# Patient Record
Sex: Female | Born: 2000
Health system: Southern US, Community
[De-identification: ages and names within clinical notes are randomized; demographics above are authoritative.]

## PROBLEM LIST (undated history)

## (undated) DIAGNOSIS — R519 Headache, unspecified: Secondary | ICD-10-CM

## (undated) DIAGNOSIS — R51 Headache: Secondary | ICD-10-CM

## (undated) HISTORY — PX: NO PAST SURGERIES: SHX2092

## (undated) HISTORY — DX: Headache, unspecified: R51.9

## (undated) HISTORY — DX: Headache: R51

---
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
## 2020-02-19 DIAGNOSIS — R6889 Other general symptoms and signs: Secondary | ICD-10-CM | POA: Diagnosis not present

## 2020-02-24 ENCOUNTER — Other Ambulatory Visit: Payer: Self-pay | Admitting: Family Medicine

## 2020-02-24 ENCOUNTER — Telehealth: Payer: Self-pay | Admitting: Internal Medicine

## 2020-02-24 ENCOUNTER — Ambulatory Visit
Admission: RE | Admit: 2020-02-24 | Discharge: 2020-02-24 | Disposition: A | Discharge status: Home / Self Care | Payer: BC Managed Care – PPO | Source: Ambulatory Visit | Attending: Family Medicine | Admitting: Family Medicine

## 2020-02-24 DIAGNOSIS — R059 Cough, unspecified: Secondary | ICD-10-CM

## 2020-02-24 DIAGNOSIS — R509 Fever, unspecified: Secondary | ICD-10-CM

## 2020-02-24 DIAGNOSIS — Z03818 Encounter for observation for suspected exposure to other biological agents ruled out: Secondary | ICD-10-CM | POA: Diagnosis not present

## 2020-02-24 DIAGNOSIS — J22 Unspecified acute lower respiratory infection: Secondary | ICD-10-CM | POA: Diagnosis not present

## 2020-02-24 NOTE — Telephone Encounter (Signed)
Patient's mother states the patient got sick last weekend and has had a fever on and off. She states she was tested for strep, the flu, and covid and all were negative. She states she is not sleeping and her watch states she has only been getting 12 minutes of sleep a night. She states two nights ago her fever was 103.7, the next night she had no fever, and yesterday it was 100.8. She states she also has a tremor in her hands. She states she has temperature swings and goes from burning up to needing multiple blankets when the house is 70 degrees. Please advise.

## 2020-02-24 NOTE — Telephone Encounter (Signed)
Spoke with pt who gives verbal permission to speak with pt's mother, [name].  [name] states pt has had fever and assumed URI.  Pt had negative Covid, flu and strep screening at school last week.  [name] states pt was diagnosed with Ehler's - Danlos syndrome by Dr Graciela Husbands and mother is concerned symptoms are related to this diagnosis.  [name] advised while pt was diagnosed with joint laxity I do not see diagnosis of Ehler's Danlos syndrome in Dr Odessa Fleming note.  Pt has not seen her PCP re: current symptoms.  Advised pt's mother to schedule appointment with pt's PCP.  Will have Dr Odessa Fleming scheduler contact pt re: scheduling f/u appt with Dr Graciela Husbands.  [name] advised to keep pt well hydrated.  [name] verbalizes understanding and agrees with current plan.

## 2020-02-26 ENCOUNTER — Other Ambulatory Visit: Payer: Self-pay

## 2020-02-26 ENCOUNTER — Encounter (HOSPITAL_BASED_OUTPATIENT_CLINIC_OR_DEPARTMENT_OTHER): Payer: Self-pay

## 2020-02-26 ENCOUNTER — Emergency Department (HOSPITAL_BASED_OUTPATIENT_CLINIC_OR_DEPARTMENT_OTHER)
Admission: EM | Admit: 2020-02-26 | Discharge: 2020-02-26 | Disposition: A | Discharge status: Home / Self Care | Payer: BC Managed Care – PPO | Attending: Emergency Medicine | Admitting: Emergency Medicine

## 2020-02-26 DIAGNOSIS — B349 Viral infection, unspecified: Secondary | ICD-10-CM | POA: Insufficient documentation

## 2020-02-26 DIAGNOSIS — R509 Fever, unspecified: Secondary | ICD-10-CM | POA: Diagnosis not present

## 2020-02-26 DIAGNOSIS — R0602 Shortness of breath: Secondary | ICD-10-CM | POA: Diagnosis not present

## 2020-02-26 LAB — CBC WITH DIFFERENTIAL/PLATELET
Abs Immature Granulocytes: 0.16 10*3/uL — ABNORMAL HIGH (ref 0.00–0.07)
Basophils Absolute: 0.1 10*3/uL (ref 0.0–0.1)
Basophils Relative: 0 %
Eosinophils Absolute: 0 10*3/uL (ref 0.0–0.5)
Eosinophils Relative: 0 %
HCT: 30.5 % — ABNORMAL LOW (ref 36.0–46.0)
Hemoglobin: 9.9 g/dL — ABNORMAL LOW (ref 12.0–15.0)
Immature Granulocytes: 1 %
Lymphocytes Relative: 10 %
Lymphs Abs: 1.7 10*3/uL (ref 0.7–4.0)
MCH: 30.5 pg (ref 26.0–34.0)
MCHC: 32.5 g/dL (ref 30.0–36.0)
MCV: 93.8 fL (ref 80.0–100.0)
Monocytes Absolute: 0.8 10*3/uL (ref 0.1–1.0)
Monocytes Relative: 5 %
Neutro Abs: 13.5 10*3/uL — ABNORMAL HIGH (ref 1.7–7.7)
Neutrophils Relative %: 84 %
Platelets: 542 10*3/uL — ABNORMAL HIGH (ref 150–400)
RBC: 3.25 MIL/uL — ABNORMAL LOW (ref 3.87–5.11)
RDW: 12.4 % (ref 11.5–15.5)
WBC: 16.2 10*3/uL — ABNORMAL HIGH (ref 4.0–10.5)
nRBC: 0 % (ref 0.0–0.2)

## 2020-02-26 LAB — COMPREHENSIVE METABOLIC PANEL
ALT: 11 U/L (ref 0–44)
AST: 14 U/L — ABNORMAL LOW (ref 15–41)
Albumin: 3.1 g/dL — ABNORMAL LOW (ref 3.5–5.0)
Alkaline Phosphatase: 73 U/L (ref 38–126)
Anion gap: 12 (ref 5–15)
BUN: 7 mg/dL (ref 6–20)
CO2: 21 mmol/L — ABNORMAL LOW (ref 22–32)
Calcium: 9.3 mg/dL (ref 8.9–10.3)
Chloride: 103 mmol/L (ref 98–111)
Creatinine, Ser: 0.8 mg/dL (ref 0.44–1.00)
GFR, Estimated: >60 mL/min (ref >60)
Glucose, Bld: 133 mg/dL — ABNORMAL HIGH (ref 70–99)
Potassium: 3.7 mmol/L (ref 3.5–5.1)
Sodium: 136 mmol/L (ref 135–145)
Total Bilirubin: 0.5 mg/dL (ref 0.3–1.2)
Total Protein: 7.6 g/dL (ref 6.5–8.1)

## 2020-02-26 LAB — TSH: TSH: 0.595 u[IU]/mL (ref 0.350–4.500)

## 2020-02-26 LAB — SEDIMENTATION RATE: Sed Rate: 109 mm/hr — ABNORMAL HIGH (ref 0–22)

## 2020-02-26 MED ORDER — IBUPROFEN 400 MG PO TABS
400.0000 mg | ORAL_TABLET | Freq: Once | ORAL | Status: AC
  Administered 2020-02-26: 400 mg via ORAL
  Filled 2020-02-26: qty 1

## 2020-02-26 NOTE — ED Provider Notes (Signed)
[facility] EMERGENCY DEPARTMENT Provider Note   CSN: [id number] Arrival date & time: 02/26/20  1658   History Chief Complaint  Patient presents with  . Fever    Diana Proctor is a 19 y.o. female w/ PMHx of vertigo, orthostatic hypotension, migraines, sleep disturbance, anxiety/depression who presents for recurrent fever for 12 days.  First day of illness was reported to be 12/3. During this time she was tested for flu, mono, and RSV all of which were negative. Associated symptoms of fatigue, headache, cough, night sweats, nausea, shortness of breath, loss of appetite, insomnia. Started alternating tylenol and motrin this past Saturday after Tmax of 103. Was seen by primary care 2 days ago and CXR normal, subsequently prescribed amoxicillin and doxycycline 2 days ago. Last given tylenol at 3:30 pm after tele health visit informed her to come to the ED for further evaluation.  Of note patient's last period was approximately two weeks ago, in which she bleed through her bed sheets. This has never happened before. She also had a nose bleed 1 week prior that continued for 15-20 but resolved with pinching the nose.   Endorses occasional marijuana use.  Is sexually active; did not use protection during last encounter. Denies abnormal vaginal discharge or odor.  UTD on immunizations. No recent travel history out of the country.    Past Medical History:  Diagnosis Date  . Headache     Patient Active Problem List   Diagnosis Date Noted  . Disequilibrium 05/17/2019  . Migraine without aura and without status migrainosus, not intractable 04/25/2019  . Episodic tension-type headache, not intractable 04/25/2019  . Central positional vertigo 04/25/2019  . Orthostatic hypotension 03/02/2016  . Closed head injury without loss of consciousness 12/31/2015  . Posttraumatic headache 12/31/2015  . Post concussion syndrome 12/31/2015  . Adjustment disorder with mixed anxiety and  depressed mood 01/08/2013  . Acne 01/08/2013  . Sleep disturbance 01/08/2013    Past Surgical History:  Procedure Laterality Date  . NO PAST SURGERIES       OB History   No obstetric history on file.     Family History  Problem Relation Age of Onset  . Depression Mother   . Alzheimer's disease Maternal Grandmother   . Heart attack Maternal Grandfather     Social History   Tobacco Use  . Smoking status: Never Smoker  . Smokeless tobacco: Never Used  Substance Use Topics  . Alcohol use: Never    Comment: social  . Drug use: Never    Home Medications Prior to Admission medications   Medication Sig Start Date End Date Taking? Authorizing Provider  ascorbic acid (VITAMIN C) 500 MG tablet Take 500 mg by mouth daily.    [provider]  co-enzyme Q-10 30 MG capsule Take 30 mg by mouth 3 (three) times daily. Patient not taking: Reported on 11/07/2019    [provider]  Ferrous Sulfate (IRON PO) Take by mouth.    [provider]  fludrocortisone (FLORINEF) 0.1 MG tablet Take 1 tablet daily Patient not taking: Reported on 09/26/2019 07/26/19   Deetta Perla, MD  Melatonin 1 MG TABS Take by mouth.     [provider]  Omega-3 Fatty Acids (FISH OIL) 1000 MG CAPS Take by mouth.    [provider]  pyridoxine (B-6) 100 MG tablet Take 100 mg by mouth daily.    [provider]  sertraline (ZOLOFT) 100 MG tablet Take 100 mg by mouth daily.  07/23/19   [provider]  vitamin B-12 (CYANOCOBALAMIN) 1000 MCG tablet Take 1,000 mcg by mouth daily.    [provider]  Vitamin D, Ergocalciferol, (DRISDOL) 1.25 MG (50000 UNIT) CAPS capsule Take 1 capsule (50,000 Units total) by mouth every 7 (seven) days. 09/23/19   Judi Saa, DO    Allergies    Patient has no known allergies.  Review of Systems   Review of Systems  Constitutional: Positive for appetite change, chills, fatigue and fever.  HENT: Positive  for rhinorrhea.   Respiratory: Positive for cough and shortness of breath.   Cardiovascular: Negative for chest pain.  Gastrointestinal: Negative for abdominal pain, constipation, diarrhea, nausea and vomiting.  Genitourinary: Negative for difficulty urinating, vaginal bleeding and vaginal discharge.  Musculoskeletal: Positive for myalgias.  Skin: Negative for rash.  Neurological: Positive for headaches. Negative for dizziness.  Hematological:       Endorsing increase blood flow with menses and nose bleed.     Physical Exam Updated Vital Signs BP 116/68 (BP Location: Right Arm)   Pulse 82   Temp (!) 102.8 F (39.3 C) (Oral)   Resp 18   Ht 5\' 8"  (1.727 m)   Wt 62.1 kg   LMP 02/12/2020   SpO2 100%   BMI 20.83 kg/m   Physical Exam Constitutional:      General: She is not in acute distress.    Appearance: Normal appearance. She is ill-appearing. She is not toxic-appearing.  HENT:     Head: Normocephalic.     Nose: Nose normal.     Mouth/Throat:     Mouth: Mucous membranes are moist.     Pharynx: No oropharyngeal exudate or posterior oropharyngeal erythema.  Eyes:     General: No scleral icterus.    Extraocular Movements: Extraocular movements intact.     Conjunctiva/sclera: Conjunctivae normal.     Pupils: Pupils are equal, round, and reactive to light.  Cardiovascular:     Rate and Rhythm: Normal rate and regular rhythm.     Pulses: Normal pulses.     Heart sounds: Normal heart sounds.  Pulmonary:     Effort: Pulmonary effort is normal. No respiratory distress.     Breath sounds: Normal breath sounds. No wheezing.  Abdominal:     General: Abdomen is flat. Bowel sounds are normal. There is no distension.     Palpations: Abdomen is soft. There is no mass.     Tenderness: There is no abdominal tenderness. There is no guarding or rebound.  Musculoskeletal:     Cervical back: Neck supple. No tenderness.     Comments: 5/5 strength U+L ext. bilaterally  Lymphadenopathy:      Cervical: No cervical adenopathy.  Skin:    Findings: No bruising or rash.  Neurological:     Mental Status: She is alert and oriented to person, place, and time.  Psychiatric:        Mood and Affect: Mood normal.        Behavior: Behavior normal.    ED Results / Procedures / Treatments   Labs (all labs ordered are listed, but only abnormal results are displayed) Labs Reviewed  CBC WITH DIFFERENTIAL/PLATELET - Abnormal; Notable for the following components:      Result Value   WBC 16.2 (*)    RBC 3.25 (*)    Hemoglobin 9.9 (*)    HCT 30.5 (*)    Platelets 542 (*)    Neutro Abs 13.5 (*)  Abs Immature Granulocytes 0.16 (*)    All other components within normal limits  COMPREHENSIVE METABOLIC PANEL - Abnormal; Notable for the following components:   CO2 21 (*)    Glucose, Bld 133 (*)    Albumin 3.1 (*)    AST 14 (*)    All other components within normal limits  CULTURE, BLOOD (ROUTINE X 2)  CULTURE, BLOOD (ROUTINE X 2)  TSH  T4, FREE  HIV-1/2 AB - DIFFERENTIATION  HIV-1 RNA QUANT-NO REFLEX-BLD  CMV IGM  CMV ANTIBODY, IGG (EIA)  HEPATITIS PANEL, ACUTE  SEDIMENTATION RATE  C-REACTIVE PROTEIN  ANTINUCLEAR ANTIBODIES, IFA  RHEUMATOID FACTOR  LACTATE DEHYDROGENASE   Medications Ordered in ED Medications  ibuprofen (ADVIL) tablet 400 mg (400 mg Oral Given 02/26/20 1837)    ED Course  I have reviewed the triage vital signs and the nursing notes.  Pertinent labs & imaging results that were available during my care of the patient were reviewed by me and considered in my medical decision making (see chart for details).  CBC w/ increased white count, platelets, and neutrophils indicating infectious process CMP unremarkable   MDM Rules/Calculators/A&P                          Initially does not appear well. Febrile given ibuprofen x1 otherwise VSS. No acute findings on physical exam. Review CXR from yesterday and without acute cardiopulmonary findings. Patient  continued to fever through antipyretics and antibiotics (doxycycline and amoxicillin). Patient without travel out of country, known sick antibiotcs, and recently tested negative for flu, mono, RSV, and COVID. Continues to endorse general malaise and body aches.   Consulted Dr. Daiva Eves infectious disease who suggested viral labs, inflammatory markers, autoimmune markers, and hepatitis panel. TB-Quantiferon unable to be obtained due to time sensitive lab. Also will consider CT chest outpatient with persistent fever.   Upon reassessment patient endorsed feeling better. Would like to eat. Discuss patient is stable for discharge home with follow up for infectious disease in 1-2 days. Continue course of antibiotics. Mother and patient agreed with this plan.   Temp 98.3 at time of discharge.   Final Clinical Impression(s) / ED Diagnoses Final diagnoses:  Febrile illness    Rx / DC Orders ED Discharge Orders    None       Autry-Lott, Annely Sliva, DO 02/26/20 2258    Melene Plan, DO 02/26/20 2302

## 2020-02-26 NOTE — ED Triage Notes (Signed)
Pt arrives with mother who reports that she has been tested for covid 3 times, flu, Mono, and RSV. All of which have come back negative. Pt reports she started feeling ill Dec. 3rd. Highest temp at home was 103.7 on Dec. 11th. Mother reports using tylenol and ibuprofen alternating for over a week. She also had a chest X Ray which was clear but was started on amoxacillin and doxy on Monday night.

## 2020-02-26 NOTE — ED Triage Notes (Signed)
Pt had 1000 mg of tylenol at 3:30 pm today when her temp was 102.1.

## 2020-02-26 NOTE — Discharge Instructions (Addendum)
For fever you can continue to alternate tylenol and ibuprofen. Complete full antibiotic course. If symptoms worsen or new symptoms arise return to ED. Schedule appointment with infectious disease in 1-2 days.

## 2020-02-27 ENCOUNTER — Ambulatory Visit (INDEPENDENT_AMBULATORY_CARE_PROVIDER_SITE_OTHER): Payer: BC Managed Care – PPO | Admitting: Infectious Diseases

## 2020-02-27 ENCOUNTER — Telehealth: Payer: Self-pay | Admitting: Infectious Disease

## 2020-02-27 ENCOUNTER — Encounter: Payer: Self-pay | Admitting: Infectious Diseases

## 2020-02-27 VITALS — BP 116/76 | HR 106 | Temp 99.0°F

## 2020-02-27 DIAGNOSIS — R509 Fever, unspecified: Secondary | ICD-10-CM | POA: Diagnosis not present

## 2020-02-27 LAB — C-REACTIVE PROTEIN: CRP: 17.5 mg/dL — ABNORMAL HIGH (ref <1.0)

## 2020-02-27 LAB — T4, FREE: Free T4: 0.82 ng/dL (ref 0.61–1.12)

## 2020-02-27 LAB — HEPATITIS PANEL, ACUTE
HCV Ab: NONREACTIVE
Hep A IgM: NONREACTIVE
Hep B C IgM: NONREACTIVE
Hepatitis B Surface Ag: NONREACTIVE

## 2020-02-27 LAB — LACTATE DEHYDROGENASE: LDH: 127 U/L (ref 98–192)

## 2020-02-27 NOTE — Telephone Encounter (Signed)
Patient with Fever of unknown origin seen twice in the ER.  I recommended labs and promised that we would see her in the clinic next week can she be scheduled with myself or another partner next week?

## 2020-02-27 NOTE — Progress Notes (Signed)
[facility]                                                             [address] [place], [place], [place], [address]                                                                  Phn. [phone]; Fax: [phone]                                                                             Date: 02/27/20  Reason for Referral: Fever of Unknown Origin  Referring Provider: Kathyrn Lass   Assessment/Plan 57 Y O Caucasian Female with a PMH of arachnodactyly/Joint Hypermobility Syndrome with concerns of Ehlers Danlers Syndrome, Dizziness/Orthostatic Hypotension with h/o prior rt hip subluxation, remote hx of traumatic/concussive headaches(2017 who is here for evaluation of fevers going on for more than 2 weeks.   Her history is more compatible with a viral illness. However, she has elevated neutrophilic leukocytosis. She does not look acutely ill. LFTS are normal. HB has dropped from 13.4 to 9.9.  LDH is WNL Will get hemolysis labs with reticulocycte count and haptoglobin. Denies any travel h/o suggestive of babesiosis.  ESR 109, CRP 17.5, RF borderline at 14.2  Acute Hepatitis panel for Hep A/B and C is negative. HIV is pending. CMV is pending.  Blood cx 12/15 are pending   Will get following labs today 2 more sets of blood cultures Resp PCR panel  Monospot EBV panel Human Parvo Vitrus PCR  Reticulocyte count and haptoglobin Qunatiferon CK  Will follow up in 1 weekI have discussed with patient and parents concerning signs and symptoms for immediate visit to the hospital.   All questions and concerns were discussed and addressed. Patient verbalized understanding of the plan. ____________________________________________________________________________________________________________________  HPI: 19 Y O Caucasian Female with a PMH of arachnodactyly/Joint Hypermobility Syndrome with concerns  of Ehlers Danlers Syndrome, Dizziness/Orthostatic Hypotension with h/o prior rt hip subluxation, remote hx of traumatic/concussive headaches(2017 who is here for evaluation of fevers going on for more than 2 weeks.   Patient started having fever since beginning of December. This was associated with cough, congestion, chills, sweating, body aches and sore throat and fatigue. She also complains of having drenching sweats 1 week prior to having fever. Mother says she had a " stomach"  bug in January 2021 for which she was seen in the ED after which she contracted COVID from her father in February. She was not symptomatic from COVID and did not need to be hospitalized or require COVID specific monoclonal antibodies and antivirals. She however had loss of taste and loss of smell. Mother says she continued to have lingering symptoms after having COVID.   She was also  seen by Dr Delice Lesch for dizziness and was offered to have handicapped sticker which she declined. She started having tremors in October followed by difficulty in sleeping for which she was prescribed Hydroxyzine. However, it did not help her to sleep. She continued to be more exhausted and fatigued. She started having drenching sweats at the end of November before starting to have fevers in early December.  Fever was as high as 103. She has been taking tylenol and Ibuprofen alternatively for fevers. She was also tested for Flu/Mono and RSV on 12/3 which was negative. Was seen by primary care 3 days ago and CXR normal, She was given 7 days course of Amoxicillin and Doxycycline which she has been taking currently. Denies any benefit from taking abx,   Appetite is not so good. Has lost 5-6 lbs weight recently Denies n/v/d. Denies urinary sympyoms. Bilateral hip and knee pain + Back pain+ Headache. Denies sinus pain and ear pain  Denies rashes  Denies heaadache, blurry vision and hearing problem. Denies dysphagia/odynophagia Denies h/o frequent  infections. Denies any insect bite, tick bite In 2016 went to [place] for mission trip. Denies travel out of the state except [place] and [place] Denies any hiking but has been participating in rowing.    Born in [place]. She is currently student at [employer], [place] and came home to [facility] for christmas break. She has a dog  And 2 fish at school Smokes marijuana occasionally and drinks alcohol socially and denies using drugs.  Parents also concerned about use of any drugs in drinks during her college parties at school.   She is sexually active with a female partner. Last sexual activity a month ago. Last LMP 2 weeks ago. Denies any vaginal pain/discharge. Denies any urinary symptoms. She has a Cu IUD placed few years ago.   ROS: 11 point ROS negative except as above   Past Medical History:  Diagnosis Date  . Headache    Past Surgical History:  Procedure Laterality Date  . NO PAST SURGERIES     Current Outpatient Medications on File Prior to Visit  Medication Sig Dispense Refill  . amoxicillin (AMOXIL) 500 MG tablet Take by mouth.    . doxycycline (VIBRA-TABS) 100 MG tablet 1 tablet    . hydrOXYzine (ATARAX/VISTARIL) 25 MG tablet Take 25-50 mg by mouth daily as needed.    . Melatonin 1 MG TABS Take by mouth.     . sertraline (ZOLOFT) 100 MG tablet Take 100 mg by mouth daily.    Marland Kitchen ascorbic acid (VITAMIN C) 500 MG tablet Take 500 mg by mouth daily. (Patient not taking: Reported on 02/27/2020)    . co-enzyme Q-10 30 MG capsule Take 30 mg by mouth 3 (three) times daily. (Patient not taking: No sig reported)    . Ferrous Sulfate (IRON PO) Take by mouth. (Patient not taking: Reported on 02/27/2020)    . fludrocortisone (FLORINEF) 0.1 MG tablet Take 1 tablet daily (Patient not taking: No sig reported) 31 tablet 5  . Omega-3 Fatty Acids (FISH OIL) 1000 MG CAPS Take by mouth. (Patient not taking: Reported on 02/27/2020)    . pyridoxine (B-6) 100 MG tablet Take 100 mg by mouth daily. (Patient not taking: Reported  on 02/27/2020)    . vitamin B-12 (CYANOCOBALAMIN) 1000 MCG tablet Take 1,000 mcg by mouth daily. (Patient not taking: Reported on 02/27/2020)    . Vitamin D, Ergocalciferol, (DRISDOL) 1.25 MG (50000 UNIT) CAPS capsule Take 1 capsule (50,000 Units total) by  mouth every 7 (seven) days. (Patient not taking: Reported on 02/27/2020) 12 capsule 0   No current facility-administered medications on file prior to visit.   No Known Allergies  Social History   Socioeconomic History  . Marital status: Single    Spouse name: Not on file  . Number of children: Not on file  . Years of education: Not on file  . Highest education level: Not on file  Occupational History  . Not on file  Tobacco Use  . Smoking status: Never Smoker  . Smokeless tobacco: Never Used  Substance and Sexual Activity  . Alcohol use: Never    Comment: social  . Drug use: Never  . Sexual activity: Not on file  Other Topics Concern  . 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   She attended [employer].   She lives with both parents and her sister.   She enjoys Research officer, trade union, baking, rowing and eating.   She is a Museum/gallery exhibitions officer at [employer].   Social Determinants of Health   Financial Resource Strain: Not on file  Food Insecurity: Not on file  Transportation Needs: Not on file  Physical Activity: Not on file  Stress: Not on file  Social Connections: Not on file  Intimate Partner Violence: Not on file    Vitals BP 116/76   Pulse (!) 106   Temp 99 F (37.2 C) (Oral)   LMP 02/12/2020    Examination  General - not in acute distress, comfortably sitting in chair, EXHAUSTED LOOK HEENT - PEERLA, no pallor and no icterus, NO REDNESS IN THE POSTERIOR PHARYNGEAL WALL, BULGE IN THR TONSILS Chest - b/l clear air entry, no additional sounds CVS- Normal s1s2, RRR Abdomen - Soft, Non tender , non distended Ext- no pedal edema Neuro: grossly normal Back - WNL Psych : calm  and cooperative Skin - no obvious rashes    Recent labs CBC Latest Ref Rng & Units 02/26/2020 09/23/2019  WBC 4.0 - 10.5 K/uL 16.2(H) 5.0  Hemoglobin 12.0 - 15.0 g/dL 9.9(L) 13.4  Hematocrit 36.0 - 46.0 % 30.5(L) 40.3  Platelets 150 - 400 K/uL 542(H) 227   CMP Latest Ref Rng & Units 02/26/2020 09/23/2019 09/23/2019  Glucose 70 - 99 mg/dL 133(H) 77 -  BUN 6 - 20 mg/dL 7 14 -  Creatinine 0.44 - 1.00 mg/dL 0.80 0.86 -  Sodium 135 - 145 mmol/L 136 140 -  Potassium 3.5 - 5.1 mmol/L 3.7 4.6 -  Chloride 98 - 111 mmol/L 103 104 -  CO2 22 - 32 mmol/L 21(L) 28 -  Calcium 8.9 - 10.3 mg/dL 9.3 10.1 10.1  Total Protein 6.5 - 8.1 g/dL 7.6 7.0 -  Total Bilirubin 0.3 - 1.2 mg/dL 0.5 1.1 -  Alkaline Phos 38 - 126 U/L 73 - -  AST 15 - 41 U/L 14(L) 18 -  ALT 0 - 44 U/L 11 11 -     Pertinent Microbiology Results for orders placed or performed in visit on 04/29/19  Novel Coronavirus, NAA ([facility])     Status: Abnormal   Collection Time: 04/29/19  1:39 PM   Specimen: Nasopharyngeal(NP) swabs in vial transport medium   NASOPHARYNGE  TESTING  Result Value Ref Range Status   SARS-CoV-2, NAA Detected (A) Not Detected Final    Comment: Testing was performed using the cobas(R) SARS-CoV-2 test. This nucleic acid amplification test was developed and its performance characteristics determined by Becton, Dickinson and Company. Nucleic  acid amplification tests include RT-PCR and TMA. This test has not been FDA cleared or approved. This test has been authorized by FDA under an Emergency Use Authorization (EUA). This test is only authorized for the duration of time the declaration that circumstances exist justifying the authorization of the emergency use of in vitro diagnostic tests for detection of SARS-CoV-2 virus and/or diagnosis of COVID-19 infection under section 564(b)(1) of the Act, 21 U.S.C. 257DYN-1(G) (1), unless the authorization is terminated or revoked sooner. When diagnostic testing is negative, the  possibility of a false negative result should be considered in the context of a patient's recent exposures and the presence of clinical signs and symptoms consistent with COVID-19. An individual without sympto ms of COVID-19 and who is not shedding SARS-CoV-2 virus would expect to have a negative (not detected) result in this assay.     Pertinent Imaging Chest Xray 02/24/20 FINDINGS: The heart size and mediastinal contours are within normal limits. Both lungs are clear. The visualized skeletal structures are unremarkable.  IMPRESSION: No active cardiopulmonary disease.  All pertinent labs/Imagings/notes reviewed. All pertinent plain films and CT images have been personally visualized and interpreted; radiology reports have been reviewed. Decision making incorporated into the Impression / Recommendations.  I spent greater than 50 minutes with the patient including  review of prior medical records with greater than 50% of time in face to face counsel of the patient.    Electronically signed by:  Rosiland Oz, MD Infectious Disease Physician [facility] [address] [place], [place] [address] Phone: [phone]  Fax: [phone]

## 2020-02-27 NOTE — Telephone Encounter (Signed)
Patient scheduled with Dr. Elinor Parkinson today. Thanks

## 2020-02-27 NOTE — Telephone Encounter (Signed)
Excellent

## 2020-02-28 DIAGNOSIS — R509 Fever, unspecified: Secondary | ICD-10-CM | POA: Insufficient documentation

## 2020-02-28 LAB — HIV-1 RNA QUANT-NO REFLEX-BLD

## 2020-02-28 LAB — CMV IGM: CMV IgM: <30 AU/mL (ref 0.0–29.9)

## 2020-02-28 LAB — RHEUMATOID FACTOR: Rheumatoid fact SerPl-aCnc: 14.2 IU/mL — ABNORMAL HIGH (ref <14.0)

## 2020-02-28 LAB — CMV ANTIBODY, IGG (EIA): CMV Ab - IgG: 3 U/mL — ABNORMAL HIGH (ref 0.00–0.59)

## 2020-02-28 NOTE — Assessment & Plan Note (Signed)
Unclear cause so far Will get additional labs today  FU in 1 week

## 2020-02-29 LAB — ANTINUCLEAR ANTIBODIES, IFA: ANA Ab, IFA: POSITIVE — AB

## 2020-02-29 LAB — FANA STAINING PATTERNS: Homogeneous Pattern: 1

## 2020-03-01 LAB — HIV-1/2 AB - DIFFERENTIATION
HIV 1 Ab: NEGATIVE
HIV 2 Ab: NEGATIVE
Note: NEGATIVE

## 2020-03-01 LAB — RNA QUALITATIVE: HIV 1 RNA Qualitative: 1

## 2020-03-02 LAB — CULTURE, BLOOD (ROUTINE X 2)
Culture: NO GROWTH
Culture: NO GROWTH
Special Requests: ADEQUATE
Special Requests: ADEQUATE

## 2020-03-04 ENCOUNTER — Ambulatory Visit (INDEPENDENT_AMBULATORY_CARE_PROVIDER_SITE_OTHER): Payer: BC Managed Care – PPO | Admitting: Internal Medicine

## 2020-03-04 ENCOUNTER — Other Ambulatory Visit: Payer: Self-pay

## 2020-03-04 ENCOUNTER — Encounter: Payer: Self-pay | Admitting: Internal Medicine

## 2020-03-04 VITALS — BP 112/66 | HR 91 | Ht 68.0 in | Wt 132.0 lb

## 2020-03-04 DIAGNOSIS — I951 Orthostatic hypotension: Secondary | ICD-10-CM

## 2020-03-04 LAB — MONONUCLEOSIS SCREEN: Heterophile, Mono Screen: NEGATIVE

## 2020-03-04 LAB — CULTURE, BLOOD (SINGLE)
MICRO NUMBER:: 11331619
MICRO NUMBER:: 11331620
Result:: NO GROWTH
Result:: NO GROWTH
SPECIMEN QUALITY:: ADEQUATE
SPECIMEN QUALITY:: ADEQUATE

## 2020-03-04 LAB — QUANTIFERON-TB GOLD PLUS
Mitogen-NIL: 0.97 IU/mL
NIL: 0.01 IU/mL
QuantiFERON-TB Gold Plus: NEGATIVE
TB1-NIL: 0 IU/mL
TB2-NIL: 0.01 IU/mL

## 2020-03-04 LAB — EPSTEIN-BARR VIRUS VCA ANTIBODY PANEL
EBV NA IgG: 185 U/mL — ABNORMAL HIGH
EBV VCA IgG: 168 U/mL — ABNORMAL HIGH
EBV VCA IgM: <36 U/mL

## 2020-03-04 LAB — HUMAN PARVOVIRUS DNA DETECTION BY PCR: PARVOVIRUS B19 DNA,QL REAL TIME PCR: NOT DETECTED

## 2020-03-04 LAB — CK: Total CK: 27 U/L — ABNORMAL LOW (ref 29–143)

## 2020-03-04 LAB — RETICULOCYTES
ABS Retic: 24850 cells/uL (ref 20000–8000)
Retic Ct Pct: 0.7 %

## 2020-03-04 LAB — HAPTOGLOBIN: Haptoglobin: 514 mg/dL — ABNORMAL HIGH (ref 43–212)

## 2020-03-04 MED ORDER — IRON (FERROUS SULFATE) 325 (65 FE) MG PO TABS: 325.0000 mg | ORAL_TABLET | Freq: Two times a day (BID) | ORAL | Status: DC

## 2020-03-04 NOTE — Progress Notes (Signed)
      Patient Care Team: Sigmund Hazel, MD as PCP - General (Family Medicine)   HPI  Diana Proctor is a 19 y.o. female Seen in follow-up for symptoms of dizziness and presyncope, orthostatic and heat intolerance with a remote history of concussive headaches and intercurrent viral/Covid infection  Functional status has been pretty good over the fall of her sophomore year at [employer].  She is struggled with lightheadedness and shortness of breath with palpitations while walking particularly up hills.  Metromenorrhagia but has not been impressed that her symptoms are worse at the time of her.  Uses some alcohol but no association of symptoms.  She has been on Zoloft for some time.  Her sleep over recent months has become notably worse.  This has been aggravated/informed by the use of wearable technology.  She is also had problems worsening of night sweats.  Been noted also over the last 2 weeks to have fevers as high as 103.  Also noted a tremor over recent weeks; over Thanksgiving also with significant fatigue  History of hip subluxation    Records and Results Reviewed   Past Medical History:  Diagnosis Date  . Headache     Past Surgical History:  Procedure Laterality Date  . NO PAST SURGERIES      Current Meds  Medication Sig  . hydrOXYzine (ATARAX/VISTARIL) 25 MG tablet Take 25-50 mg by mouth daily as needed.  . Melatonin 1 MG TABS Take by mouth.   . sertraline (ZOLOFT) 100 MG tablet Take 100 mg by mouth daily.    No Known Allergies    Review of Systems negative except from HPI and PMH  Physical Exam BP 112/66   Pulse 91   Ht 5\' 8"  (1.727 m)   Wt 132 lb (59.9 kg)   LMP 02/12/2020   SpO2 97%   BMI 20.07 kg/m  Well developed and well nourished in no acute distress HENT normal E scleral and icterus clear Neck Supple JVP flat; carotids brisk and full Clear to ausculation  Regular rate and rhythm, no murmurs gallops or rub Soft with active bowel sounds No  clubbing cyanosis  Edema Alert and oriented, grossly normal motor and sensory function Skin Warm and Dry  ECG *sinus at 91 Interval 15/07/36  Estimated Creatinine Clearance: 107 mL/min (by C-G formula based on SCr of 0.8 mg/dL).   Assessment and  Plan  Dysautonomia  Ehlers-Danlos/hypermobility  Arachnodactyly  Fever of unknown origin  Tremor  Sleep disturbance    The patient's dysautonomia is modestly well compensated. She struggles with heat. Her salt and fluid repletion is incomplete. Encouraged her to increase these  She has significant metromenorrhagia. She is anemic. We will start her on iron sulfate 1 pill twice daily. I have asked her to consider oral contraceptive suppression of her menses.  We will give her a handicap sticker so as to try to minimize her symptoms walking around campus.  With her history of joint subluxation, I think referral to PT and or orthopedics/rheumatology would be appropriate for long-term management of the try to avoid things like arthritis  She is being seen currently by infectious disease for her fevers. Perhaps her tremors are related.      Current medicines are reviewed at length with the patient today .  The patient does not  have concerns regarding medicines.

## 2020-03-04 NOTE — Patient Instructions (Signed)
Medication Instructions:  Your physician has recommended you make the following change in your medication:   ** Begin Iron Sulfate - 1 tablet by mouth twice daily.  *If you need a refill on your cardiac medications before your next appointment, please call your pharmacy*   Lab Work: None ordered.  If you have labs (blood work) drawn today and your tests are completely normal, you will receive your results only by: Marland Kitchen MyChart Message (if you have MyChart) OR . A paper copy in the mail If you have any lab test that is abnormal or we need to change your treatment, we will call you to review the results.   Testing/Procedures: None ordered.    Follow-Up: At [facility], you and your health needs are our priority.  As part of our continuing mission to provide you with exceptional heart care, we have created designated Provider Care Teams.  These Care Teams include your primary Cardiologist (physician) and Advanced Practice Providers (APPs -  Physician Assistants and Nurse Practitioners) who all work together to provide you with the care you need, when you need it.  We recommend signing up for the patient portal called "MyChart".  Sign up information is provided on this After Visit Summary.  MyChart is used to connect with patients for Virtual Visits (Telemedicine).  Patients are able to view lab/test results, encounter notes, upcoming appointments, etc.  Non-urgent messages can be sent to your provider as well.   To learn more about what you can do with MyChart, go to [email or web address].    Your next appointment:   6 month(s)  The format for your next appointment:   In Person  Provider:    Dr Graciela Husbands   Other Instructions Provided Handicap Parking Placard Application

## 2020-03-05 ENCOUNTER — Encounter: Payer: Self-pay | Admitting: Internal Medicine

## 2020-03-05 ENCOUNTER — Ambulatory Visit (INDEPENDENT_AMBULATORY_CARE_PROVIDER_SITE_OTHER): Payer: BC Managed Care – PPO | Admitting: Internal Medicine

## 2020-03-05 VITALS — BP 112/74 | HR 80 | Temp 98.2°F | Wt 132.0 lb

## 2020-03-05 DIAGNOSIS — R509 Fever, unspecified: Secondary | ICD-10-CM

## 2020-03-05 NOTE — Patient Instructions (Addendum)
Thank you for seeing [place] today   I am glad your fever at this time has resolved. However would still be on the look out for ongoing fever, and record heart rate per minute around the time of fever  Today, will complete the hiv testing and repeat liver function/blood count   In around 4-6 weeks, I would advise also to repeat hepatitis blood test, which you can do at [facility]. At that time, please make a video visit if you are unable to physically be here (make sure you do blood tests 1-2 weeks before that and have results with you)

## 2020-03-05 NOTE — Progress Notes (Signed)
[facility]  Patient Active Problem List   Diagnosis Date Noted  . Fever of unknown origin 02/28/2020  . Disequilibrium 05/17/2019  . Migraine without aura and without status migrainosus, not intractable 04/25/2019  . Episodic tension-type headache, not intractable 04/25/2019  . Central positional vertigo 04/25/2019  . Orthostatic hypotension 03/02/2016  . Closed head injury without loss of consciousness 12/31/2015  . Posttraumatic headache 12/31/2015  . Post concussion syndrome 12/31/2015  . Adjustment disorder with mixed anxiety and depressed mood 01/08/2013  . Acne 01/08/2013  . Sleep disturbance 01/08/2013      Subjective:    Patient ID: Diana Proctor, female    DOB: [birth year], 19 y.o.   MRN: [id number]  Chief Complaint  Patient presents with  . Follow-up    Fever, unspecified fever cause    HPI:  Diana Proctor is a 19 y.o. 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 No further fever She mentions her arms look a little yellow but not her eyes; no pruritus No headache, rash, further joint pain, n/v/diarrhea today  Parents accompany her today and are fine  No Known Allergies    Outpatient Medications Prior to Visit  Medication Sig Dispense Refill  . hydrOXYzine (ATARAX/VISTARIL) 25 MG tablet Take 25-50 mg by mouth daily as needed.    . Iron, Ferrous Sulfate, 325 (65 Fe) MG TABS Take 325 mg by mouth 2 (two) times daily. 30 tablet   . Melatonin 1 MG TABS Take by mouth.      . sertraline (ZOLOFT) 100 MG tablet Take 100 mg by mouth daily.     No facility-administered medications prior to visit.     Social History   Socioeconomic History  . Marital status: Single    Spouse name: Not on file  . Number of children: Not on file  . Years of education: Not on file  . Highest education level: Not on file  Occupational History  . Not on file  Tobacco Use  . Smoking status: Never Smoker  . Smokeless tobacco: Never Used  Substance and Sexual Activity  . Alcohol use: Never    Comment: social  . Drug use: Never  . Sexual activity: Not on file  Other Topics Concern  . 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   She attended [employer].   She lives with both parents and her sister.   She enjoys Doctor, hospital, baking, rowing and eating.   She is a Printmaker at [employer].   Social Determinants of Health   Financial Resource Strain: Not on file  Food Insecurity: Not on file  Transportation Needs: Not on file  Physical Activity: Not on file  Stress: Not on file  Social Connections: Not on file  Intimate Partner Violence: Not on file      Review of Systems   other ros negative  Objective:    BP 112/74   Pulse  80   Temp 98.2 F (36.8 C) (Oral)   Wt 132 lb (59.9 kg)   LMP 02/12/2020   BMI 20.07 kg/m  Nursing note and vital signs reviewed.  Physical Exam    well appearing; no distress; conversant Normocephalic; per; eomi; conj clear Neck supple cv rrr no mrg Lungs clear; normal respiratory effort abd s/nt Ext no edema Skin no jaundice or rash msk no synovitis Neuro nonfocal   Labs: Reviewed 12/15 normal lft; wbc 16 neutrophilic  Micro: 32/95 bcx negative  Serology: 12/15 hep b/a/c serology negative; hiv ab negative; parvovirus pcr negative  Imaging:  Assessment & Plan:   Problem List Items Addressed This Visit      Other   Fever of unknown origin - Primary     Young  female without significant problem or id epidemiology except active in a relationship with boyfriend who had 2 weeks of fever and gi/uri sx, here for f/u  Her fever and most of the sx had resolved  We reviewed labs especially the hep b/hiv labs which the hiv viral load not done, and the hepatitis serology needs to be repeated  I agree this is likely a nonspecific viral syndrome. I discussed for hep b the serology might take a few more weeks to be positive if its the case. Similar for hiv, the test might not be positive early in the course.  If fever recurrent again, I would consider blood smear and pan-body scan    -cbc, cmp today along with hiv viral load -f/u 6 weeks with 2 week preclinic hepatitis abc serology repeat; can do video visit -f/u sooner if ongoing fever or new sx of concern   I am having Diana Proctor maintain her melatonin, sertraline, hydrOXYzine, and Iron (Ferrous Sulfate).   No orders of the defined types were placed in this encounter.    Follow-up: Return in about 6 weeks (around 04/16/2020).      Raymondo Band, MD [facility] -- -- pager   [phone] cell 03/05/2020, 9:11 AM

## 2020-03-09 LAB — RESPIRATORY VIRUS PCR, PANEL
ADENOVIRUS DNA,QL PCR: NOT DETECTED
HUMAN METAPNEUMOVIRUS RNA, QL RT PCR: NOT DETECTED
INFLUENZA B RNA,PCR: NOT DETECTED
Influenza A: NOT DETECTED
PARAINFLUENZA 4 RNA: NOT DETECTED
Parainfluenza 1: NOT DETECTED
Parainfluenza 2: NOT DETECTED
RHNOVRS RNA RT PCR: NOT DETECTED
RSV RNA QL PCR: NOT DETECTED

## 2020-03-17 LAB — CBC WITH DIFFERENTIAL/PLATELET
Absolute Monocytes: 384 cells/uL (ref 200–950)
Basophils Absolute: 64 cells/uL (ref 0–200)
Basophils Relative: 0.8 %
Eosinophils Absolute: 88 cells/uL (ref 15–500)
Eosinophils Relative: 1.1 %
HCT: 32.8 % — ABNORMAL LOW (ref 35.0–45.0)
Hemoglobin: 10.7 g/dL — ABNORMAL LOW (ref 11.7–15.5)
Lymphs Abs: 2368 cells/uL (ref 850–3900)
MCH: 30 pg (ref 27.0–33.0)
MCHC: 32.6 g/dL (ref 32.0–36.0)
MCV: 91.9 fL (ref 80.0–100.0)
MPV: 9 fL (ref 7.5–12.5)
Monocytes Relative: 4.8 %
Neutro Abs: 5096 cells/uL (ref 1500–7800)
Neutrophils Relative %: 63.7 %
Platelets: 511 10*3/uL — ABNORMAL HIGH (ref 140–400)
RBC: 3.57 10*6/uL — ABNORMAL LOW (ref 3.80–5.10)
RDW: 12.3 % (ref 11.0–15.0)
Total Lymphocyte: 29.6 %
WBC: 8 10*3/uL (ref 3.8–10.8)

## 2020-03-17 LAB — COMPREHENSIVE METABOLIC PANEL
AG Ratio: 1.1 (calc) (ref 1.0–2.5)
ALT: 6 U/L (ref 5–32)
AST: 11 U/L — ABNORMAL LOW (ref 12–32)
Albumin: 3.8 g/dL (ref 3.6–5.1)
Alkaline phosphatase (APISO): 79 U/L (ref 36–128)
BUN: 13 mg/dL (ref 7–20)
CO2: 27 mmol/L (ref 20–32)
Calcium: 9.8 mg/dL (ref 8.9–10.4)
Chloride: 103 mmol/L (ref 98–110)
Creat: 0.77 mg/dL (ref 0.50–1.00)
Globulin: 3.5 g/dL (calc) (ref 2.0–3.8)
Glucose, Bld: 90 mg/dL (ref 65–99)
Potassium: 4.5 mmol/L (ref 3.8–5.1)
Sodium: 140 mmol/L (ref 135–146)
Total Bilirubin: 0.4 mg/dL (ref 0.2–1.1)
Total Protein: 7.3 g/dL (ref 6.3–8.2)

## 2020-03-17 LAB — HIV-1 RNA QUANT-NO REFLEX-BLD
HIV 1 RNA Quant: <20 Copies/mL
HIV-1 RNA Quant, Log: <1.3 Log cps/mL

## 2020-03-19 ENCOUNTER — Telehealth: Payer: Self-pay | Admitting: *Deleted

## 2020-03-19 NOTE — Telephone Encounter (Signed)
-----   Message from Raymondo Band, MD sent at 03/19/2020  1:39 PM EST ----- Hi team Could we let her know the hiv testing was negative  Her inflammatory number is improving She should do the labs (hepatitis) that we have discussed (and I have written prescription for in the next few weeks. And fax [place] result once available  Follow up with [place] only as needed if ongoing fever or abnormal hepatitis labs  thanks

## 2020-03-19 NOTE — Telephone Encounter (Signed)
Relayed Dr Orlando Penner message to patient. She verbalized understanding, agreement.  Will send mychart message with fax number for lab results to be sent to Dr Renold Don once obtained. Andree Coss, RN

## 2020-04-07 ENCOUNTER — Telehealth: Payer: BC Managed Care – PPO | Admitting: Internal Medicine

## 2020-04-09 ENCOUNTER — Telehealth: Payer: BC Managed Care – PPO | Admitting: Internal Medicine

## 2020-04-28 DIAGNOSIS — F419 Anxiety disorder, unspecified: Secondary | ICD-10-CM | POA: Diagnosis not present

## 2020-04-28 DIAGNOSIS — Q796 Ehlers-Danlos syndrome, unspecified: Secondary | ICD-10-CM | POA: Diagnosis not present

## 2020-05-04 ENCOUNTER — Other Ambulatory Visit: Payer: Self-pay

## 2020-05-04 ENCOUNTER — Telehealth: Payer: Self-pay

## 2020-05-04 ENCOUNTER — Telehealth (INDEPENDENT_AMBULATORY_CARE_PROVIDER_SITE_OTHER): Payer: BC Managed Care – PPO | Admitting: Internal Medicine

## 2020-05-04 ENCOUNTER — Encounter: Payer: Self-pay | Admitting: Internal Medicine

## 2020-05-04 DIAGNOSIS — R509 Fever, unspecified: Secondary | ICD-10-CM

## 2020-05-04 NOTE — Telephone Encounter (Signed)
RN spoke with [facility] to request that patient's labs from last week be faxed to our office. They state that they have no record of the patient being at their clinic since December of 2021. They state they have no labs that our office requested on file.   Sandie Ano, RN

## 2020-05-04 NOTE — Progress Notes (Signed)
[facility]  Patient Active Problem List   Diagnosis Date Noted  . Fever of unknown origin 02/28/2020  . Disequilibrium 05/17/2019  . Migraine without aura and without status migrainosus, not intractable 04/25/2019  . Episodic tension-type headache, not intractable 04/25/2019  . Central positional vertigo 04/25/2019  . Orthostatic hypotension 03/02/2016  . Closed head injury without loss of consciousness 12/31/2015  . Posttraumatic headache 12/31/2015  . Post concussion syndrome 12/31/2015  . Adjustment disorder with mixed anxiety and depressed mood 01/08/2013  . Acne 01/08/2013  . Sleep disturbance 01/08/2013      Subjective:    Patient ID: Diana Proctor, female    DOB: [birth year], 20 y.o.   MRN: [id number]  Chief Complaint  Patient presents with  . Follow-up    HPI:  Diana Proctor is a 20 y.o. female here for f/u fever unknown origin  05/04/20 id clinic f/u I verified that I was speaking with the correct person using two identifiers. Due to the COVID-19 Pandemic, this service was provided via telemedicine using audio/visual media.   The patient was located at home. The provider was located in the office. The patient did consent to this visit and is aware of charges through their insurance as well as the limitations of evaluation and management by telemedicine. Other persons participating in this telemedicine service were none. 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 No further fever She mentions her arms look a little yellow but not her eyes; no pruritus No headache, rash, further joint pain, n/v/diarrhea today  Parents accompany her today and are fine  No Known Allergies    Outpatient Medications Prior to Visit  Medication Sig Dispense Refill  . Melatonin 1 MG TABS Take by mouth.     . sertraline (ZOLOFT) 100 MG tablet Take 100 mg by mouth daily.    . sertraline (ZOLOFT) 50 MG tablet Take 75 mg by mouth daily.    . hydrOXYzine (ATARAX/VISTARIL) 25 MG tablet Take 25-50 mg by mouth daily as needed. (Patient not taking: Reported on 05/04/2020)    . Iron, Ferrous Sulfate, 325 (65 Fe) MG TABS Take 325 mg by mouth 2 (two) times daily. (Patient not taking: Reported on 05/04/2020) 30 tablet    No facility-administered medications prior to visit.     Social History   Socioeconomic History  . Marital status: Single    Spouse name: Not on file  . Number of children: Not on file  . Years of education: Not on file  . Highest education level: Not on file  Occupational History  . Not on file  Tobacco Use  . Smoking status: Never Smoker  . Smokeless tobacco: Never Used  Substance and Sexual Activity  .  Alcohol use: Yes    Comment: social  . Drug use: Never  . Sexual activity: Not on file  Other Topics Concern  . 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   She attended [employer].   She lives with both parents and her sister.   She enjoys Doctor, hospital, baking, rowing and eating.   She is a Printmaker at [employer].   Social Determinants of Health   Financial Resource Strain: Not on file  Food Insecurity: Not on file  Transportation Needs: Not on file  Physical  Activity: Not on file  Stress: Not on file  Social Connections: Not on file  Intimate Partner Violence: Not on file      Review of Systems   other ros negative  Objective:    There were no vitals taken for this visit. Nursing note and vital signs reviewed.  Physical Exam    well appearing; no distress; conversant Normocephalic; per; eomi; conj clear Lungs clear; normal respiratory effort  Rest of exam deferred as this is a televideo visit  Labs: Reviewed 12/15 normal lft; wbc 16 neutrophilic  Micro: 45/62 bcx negative  Serology: 12/15 hep b/a/c serology negative; hiv ab negative; parvovirus pcr negative  Imaging:  Assessment & Plan:   Problem List Items Addressed This Visit      Other   Fever of unknown origin - Primary     Young female without significant problem or id epidemiology except active in a relationship with boyfriend who had 2 weeks of fever and gi/uri sx, here for f/u  Her fever and most of the sx had resolved since 03/05/20 visit. I discussed with her to get repeat hepatitis testing done due to long incubation and potential for chronic assymptomatic infection. She is agreeable   -patient to do hep abc blood test at her school. She is to send [place] a message via mychart once it done so we can call her school to get result when available -otherwise f/u our clinic as needed   Follow-up: Return if symptoms worsen or fail to improve.  This is a video visit    Raymondo Band, MD [facility] -- -- pager   [phone] cell 05/04/2020, 2:45 PM

## 2020-06-14 DIAGNOSIS — Z03818 Encounter for observation for suspected exposure to other biological agents ruled out: Secondary | ICD-10-CM | POA: Diagnosis not present

## 2020-06-14 DIAGNOSIS — J029 Acute pharyngitis, unspecified: Secondary | ICD-10-CM | POA: Diagnosis not present

## 2020-06-14 DIAGNOSIS — R509 Fever, unspecified: Secondary | ICD-10-CM | POA: Diagnosis not present

## 2020-06-18 DIAGNOSIS — R509 Fever, unspecified: Secondary | ICD-10-CM | POA: Diagnosis not present

## 2020-06-18 DIAGNOSIS — R059 Cough, unspecified: Secondary | ICD-10-CM | POA: Diagnosis not present

## 2020-07-01 DIAGNOSIS — N83201 Unspecified ovarian cyst, right side: Secondary | ICD-10-CM | POA: Diagnosis not present

## 2020-07-01 DIAGNOSIS — Z309 Encounter for contraceptive management, unspecified: Secondary | ICD-10-CM | POA: Diagnosis not present

## 2020-07-16 ENCOUNTER — Encounter (INDEPENDENT_AMBULATORY_CARE_PROVIDER_SITE_OTHER): Payer: Self-pay

## 2020-08-19 DIAGNOSIS — Z23 Encounter for immunization: Secondary | ICD-10-CM | POA: Diagnosis not present

## 2020-08-19 DIAGNOSIS — N83202 Unspecified ovarian cyst, left side: Secondary | ICD-10-CM | POA: Diagnosis not present

## 2020-08-19 DIAGNOSIS — Z113 Encounter for screening for infections with a predominantly sexual mode of transmission: Secondary | ICD-10-CM | POA: Diagnosis not present

## 2020-08-19 DIAGNOSIS — Z0000 Encounter for general adult medical examination without abnormal findings: Secondary | ICD-10-CM | POA: Diagnosis not present

## 2020-08-19 DIAGNOSIS — D649 Anemia, unspecified: Secondary | ICD-10-CM | POA: Diagnosis not present

## 2020-08-19 DIAGNOSIS — Z13220 Encounter for screening for lipoid disorders: Secondary | ICD-10-CM | POA: Diagnosis not present

## 2020-08-19 DIAGNOSIS — Q796 Ehlers-Danlos syndrome, unspecified: Secondary | ICD-10-CM | POA: Diagnosis not present

## 2020-08-19 DIAGNOSIS — N898 Other specified noninflammatory disorders of vagina: Secondary | ICD-10-CM | POA: Diagnosis not present

## 2020-09-03 DIAGNOSIS — N83209 Unspecified ovarian cyst, unspecified side: Secondary | ICD-10-CM | POA: Diagnosis not present

## 2020-09-03 DIAGNOSIS — Z30431 Encounter for routine checking of intrauterine contraceptive device: Secondary | ICD-10-CM | POA: Diagnosis not present

## 2020-09-04 ENCOUNTER — Emergency Department (HOSPITAL_COMMUNITY): Payer: BC Managed Care – PPO

## 2020-09-04 ENCOUNTER — Encounter (HOSPITAL_COMMUNITY)
Admission: EM | Disposition: A | Discharge status: Home / Self Care | Payer: Self-pay | Source: Home / Self Care | Attending: Emergency Medicine

## 2020-09-04 ENCOUNTER — Ambulatory Visit (HOSPITAL_COMMUNITY)
Admission: EM | Admit: 2020-09-04 | Discharge: 2020-09-04 | Disposition: A | Discharge status: Home / Self Care | Payer: BC Managed Care – PPO | Attending: Emergency Medicine | Admitting: Emergency Medicine

## 2020-09-04 ENCOUNTER — Emergency Department (HOSPITAL_COMMUNITY): Payer: BC Managed Care – PPO | Admitting: Anesthesiology

## 2020-09-04 ENCOUNTER — Encounter (HOSPITAL_COMMUNITY): Payer: Self-pay | Admitting: Emergency Medicine

## 2020-09-04 ENCOUNTER — Other Ambulatory Visit: Payer: Self-pay

## 2020-09-04 ENCOUNTER — Other Ambulatory Visit: Payer: Self-pay | Admitting: Obstetrics & Gynecology

## 2020-09-04 DIAGNOSIS — N83201 Unspecified ovarian cyst, right side: Secondary | ICD-10-CM | POA: Diagnosis not present

## 2020-09-04 DIAGNOSIS — Z20822 Contact with and (suspected) exposure to covid-19: Secondary | ICD-10-CM | POA: Diagnosis not present

## 2020-09-04 DIAGNOSIS — N838 Other noninflammatory disorders of ovary, fallopian tube and broad ligament: Secondary | ICD-10-CM | POA: Diagnosis not present

## 2020-09-04 DIAGNOSIS — N83202 Unspecified ovarian cyst, left side: Secondary | ICD-10-CM | POA: Diagnosis not present

## 2020-09-04 DIAGNOSIS — I951 Orthostatic hypotension: Secondary | ICD-10-CM | POA: Diagnosis not present

## 2020-09-04 DIAGNOSIS — R102 Pelvic and perineal pain: Secondary | ICD-10-CM | POA: Insufficient documentation

## 2020-09-04 DIAGNOSIS — G43009 Migraine without aura, not intractable, without status migrainosus: Secondary | ICD-10-CM | POA: Diagnosis not present

## 2020-09-04 DIAGNOSIS — N7092 Oophoritis, unspecified: Secondary | ICD-10-CM | POA: Diagnosis not present

## 2020-09-04 DIAGNOSIS — Z79899 Other long term (current) drug therapy: Secondary | ICD-10-CM | POA: Insufficient documentation

## 2020-09-04 DIAGNOSIS — L709 Acne, unspecified: Secondary | ICD-10-CM | POA: Diagnosis not present

## 2020-09-04 HISTORY — PX: LAPAROSCOPIC OVARIAN CYSTECTOMY: SHX6248

## 2020-09-04 LAB — RESP PANEL BY RT-PCR (FLU A&B, COVID) ARPGX2
Influenza A by PCR: NEGATIVE
Influenza B by PCR: NEGATIVE
SARS Coronavirus 2 by RT PCR: NEGATIVE

## 2020-09-04 LAB — TYPE AND SCREEN
ABO/RH(D): A POS
Antibody Screen: NEGATIVE

## 2020-09-04 LAB — ABO/RH: ABO/RH(D): A POS

## 2020-09-04 LAB — COMPREHENSIVE METABOLIC PANEL
ALT: 11 U/L (ref 0–44)
AST: 18 U/L (ref 15–41)
Albumin: 4.5 g/dL (ref 3.5–5.0)
Alkaline Phosphatase: 49 U/L (ref 38–126)
Anion gap: 10 (ref 5–15)
BUN: 11 mg/dL (ref 6–20)
CO2: 21 mmol/L — ABNORMAL LOW (ref 22–32)
Calcium: 9.7 mg/dL (ref 8.9–10.3)
Chloride: 105 mmol/L (ref 98–111)
Creatinine, Ser: 0.84 mg/dL (ref 0.44–1.00)
GFR, Estimated: >60 mL/min (ref >60)
Glucose, Bld: 102 mg/dL — ABNORMAL HIGH (ref 70–99)
Potassium: 3.9 mmol/L (ref 3.5–5.1)
Sodium: 136 mmol/L (ref 135–145)
Total Bilirubin: 0.9 mg/dL (ref 0.3–1.2)
Total Protein: 7.1 g/dL (ref 6.5–8.1)

## 2020-09-04 LAB — URINALYSIS, ROUTINE W REFLEX MICROSCOPIC
Bilirubin Urine: NEGATIVE
Glucose, UA: NEGATIVE mg/dL
Hgb urine dipstick: NEGATIVE
Ketones, ur: NEGATIVE mg/dL
Leukocytes,Ua: NEGATIVE
Nitrite: NEGATIVE
Protein, ur: NEGATIVE mg/dL
Specific Gravity, Urine: 1.017 (ref 1.005–1.030)
pH: 6 (ref 5.0–8.0)

## 2020-09-04 LAB — CBC
HCT: 41.2 % (ref 36.0–46.0)
Hemoglobin: 13.3 g/dL (ref 12.0–15.0)
MCH: 32.6 pg (ref 26.0–34.0)
MCHC: 32.3 g/dL (ref 30.0–36.0)
MCV: 101 fL — ABNORMAL HIGH (ref 80.0–100.0)
Platelets: 280 10*3/uL (ref 150–400)
RBC: 4.08 MIL/uL (ref 3.87–5.11)
RDW: 12.8 % (ref 11.5–15.5)
WBC: 13 10*3/uL — ABNORMAL HIGH (ref 4.0–10.5)
nRBC: 0 % (ref 0.0–0.2)

## 2020-09-04 LAB — GC/CHLAMYDIA PROBE AMP (~~LOC~~) NOT AT ARMC
Chlamydia: NEGATIVE
Comment: NEGATIVE
Comment: NORMAL
Neisseria Gonorrhea: NEGATIVE

## 2020-09-04 LAB — I-STAT BETA HCG BLOOD, ED (MC, WL, AP ONLY): I-stat hCG, quantitative: <5 m[IU]/mL (ref <5)

## 2020-09-04 LAB — WET PREP, GENITAL
Clue Cells Wet Prep HPF POC: NONE SEEN
Sperm: NONE SEEN
Trich, Wet Prep: NONE SEEN
Yeast Wet Prep HPF POC: NONE SEEN

## 2020-09-04 LAB — LIPASE, BLOOD: Lipase: 38 U/L (ref 11–51)

## 2020-09-04 LAB — PREGNANCY, URINE: Preg Test, Ur: NEGATIVE

## 2020-09-04 SURGERY — EXCISION, CYST, OVARY, LAPAROSCOPIC
Anesthesia: General | Laterality: Left

## 2020-09-04 MED ORDER — LEVOFLOXACIN IN D5W 500 MG/100ML IV SOLN
500.0000 mg | INTRAVENOUS | Status: AC
  Administered 2020-09-04: 500 mg via INTRAVENOUS
  Filled 2020-09-04 (×2): qty 100

## 2020-09-04 MED ORDER — CEFAZOLIN SODIUM-DEXTROSE 2-4 GM/100ML-% IV SOLN
INTRAVENOUS | Status: AC
  Filled 2020-09-04: qty 100

## 2020-09-04 MED ORDER — MORPHINE SULFATE (PF) 4 MG/ML IV SOLN
4.0000 mg | Freq: Once | INTRAVENOUS | Status: AC
Start: 2020-09-04 — End: 2020-09-04
  Administered 2020-09-04: 4 mg via INTRAVENOUS
  Filled 2020-09-04: qty 1

## 2020-09-04 MED ORDER — ONDANSETRON 4 MG PO TBDP: 4.0000 mg | ORAL_TABLET | Freq: Three times a day (TID) | ORAL | 0 refills | Status: AC | PRN

## 2020-09-04 MED ORDER — CHLORHEXIDINE GLUCONATE 0.12 % MT SOLN: 15.0000 mL | Freq: Once | OROMUCOSAL | Status: AC

## 2020-09-04 MED ORDER — ONDANSETRON HCL 4 MG/2ML IJ SOLN
INTRAMUSCULAR | Status: DC | PRN
  Administered 2020-09-04: 4 mg via INTRAVENOUS

## 2020-09-04 MED ORDER — MEPERIDINE HCL 25 MG/ML IJ SOLN
6.2500 mg | INTRAMUSCULAR | Status: DC | PRN
Start: 2020-09-04 — End: 2020-09-05

## 2020-09-04 MED ORDER — FENTANYL CITRATE (PF) 100 MCG/2ML IJ SOLN
INTRAMUSCULAR | Status: AC
  Administered 2020-09-04: 50 ug via INTRAVENOUS
  Filled 2020-09-04: qty 2

## 2020-09-04 MED ORDER — DEXAMETHASONE SODIUM PHOSPHATE 10 MG/ML IJ SOLN
INTRAMUSCULAR | Status: DC | PRN
  Administered 2020-09-04: 5 mg via INTRAVENOUS

## 2020-09-04 MED ORDER — FENTANYL CITRATE (PF) 100 MCG/2ML IJ SOLN: 50.0000 ug | Freq: Once | INTRAMUSCULAR | Status: AC

## 2020-09-04 MED ORDER — AMISULPRIDE (ANTIEMETIC) 5 MG/2ML IV SOLN
10.0000 mg | Freq: Once | INTRAVENOUS | Status: AC | PRN
  Administered 2020-09-04: 10 mg via INTRAVENOUS

## 2020-09-04 MED ORDER — BUPIVACAINE HCL (PF) 0.25 % IJ SOLN
INTRAMUSCULAR | Status: DC | PRN
  Administered 2020-09-04: 30 mL

## 2020-09-04 MED ORDER — KETOROLAC TROMETHAMINE 30 MG/ML IJ SOLN
INTRAMUSCULAR | Status: DC | PRN
  Administered 2020-09-04: 30 mg via INTRAVENOUS

## 2020-09-04 MED ORDER — LACTATED RINGERS IV SOLN: INTRAVENOUS | Status: DC

## 2020-09-04 MED ORDER — ONDANSETRON HCL 4 MG/2ML IJ SOLN
4.0000 mg | Freq: Once | INTRAMUSCULAR | Status: AC
  Administered 2020-09-04: 4 mg via INTRAVENOUS
  Filled 2020-09-04: qty 2

## 2020-09-04 MED ORDER — ORAL CARE MOUTH RINSE: 15.0000 mL | Freq: Once | OROMUCOSAL | Status: AC

## 2020-09-04 MED ORDER — NAPROXEN 375 MG PO TABS: 375.0000 mg | ORAL_TABLET | Freq: Two times a day (BID) | ORAL | 0 refills | Status: DC | PRN

## 2020-09-04 MED ORDER — KETOROLAC TROMETHAMINE 30 MG/ML IJ SOLN
INTRAMUSCULAR | Status: AC
  Filled 2020-09-04: qty 1

## 2020-09-04 MED ORDER — CEFAZOLIN SODIUM-DEXTROSE 2-4 GM/100ML-% IV SOLN
2.0000 g | INTRAVENOUS | Status: AC
  Administered 2020-09-04: 2 g via INTRAVENOUS

## 2020-09-04 MED ORDER — CHLORHEXIDINE GLUCONATE 0.12 % MT SOLN
OROMUCOSAL | Status: AC
  Administered 2020-09-04: 15 mL via OROMUCOSAL
  Filled 2020-09-04: qty 15

## 2020-09-04 MED ORDER — DEXMEDETOMIDINE (PRECEDEX) IN NS 20 MCG/5ML (4 MCG/ML) IV SYRINGE
PREFILLED_SYRINGE | INTRAVENOUS | Status: DC | PRN
  Administered 2020-09-04 (×2): 8 ug via INTRAVENOUS

## 2020-09-04 MED ORDER — OXYCODONE HCL 5 MG PO TABS: 5.0000 mg | ORAL_TABLET | Freq: Once | ORAL | Status: DC | PRN

## 2020-09-04 MED ORDER — SUGAMMADEX SODIUM 200 MG/2ML IV SOLN
INTRAVENOUS | Status: DC | PRN
  Administered 2020-09-04: 125 mg via INTRAVENOUS

## 2020-09-04 MED ORDER — HYDROMORPHONE HCL 1 MG/ML IJ SOLN
0.2500 mg | INTRAMUSCULAR | Status: DC | PRN
  Administered 2020-09-04: 0.25 mg via INTRAVENOUS

## 2020-09-04 MED ORDER — KETOROLAC TROMETHAMINE 30 MG/ML IJ SOLN
30.0000 mg | Freq: Once | INTRAMUSCULAR | Status: AC
  Administered 2020-09-04: 30 mg via INTRAVENOUS
  Filled 2020-09-04: qty 1

## 2020-09-04 MED ORDER — AMISULPRIDE (ANTIEMETIC) 5 MG/2ML IV SOLN
INTRAVENOUS | Status: AC
  Filled 2020-09-04: qty 4

## 2020-09-04 MED ORDER — ACETAMINOPHEN 10 MG/ML IV SOLN
INTRAVENOUS | Status: AC
  Filled 2020-09-04: qty 100

## 2020-09-04 MED ORDER — MIDAZOLAM HCL 2 MG/2ML IJ SOLN
INTRAMUSCULAR | Status: AC
  Filled 2020-09-04: qty 2

## 2020-09-04 MED ORDER — BUPIVACAINE HCL (PF) 0.25 % IJ SOLN
INTRAMUSCULAR | Status: AC
  Filled 2020-09-04: qty 30

## 2020-09-04 MED ORDER — POVIDONE-IODINE 10 % EX SWAB
2.0000 "application " | Freq: Once | CUTANEOUS | Status: AC
  Administered 2020-09-04: 2 via TOPICAL

## 2020-09-04 MED ORDER — LIDOCAINE 2% (20 MG/ML) 5 ML SYRINGE
INTRAMUSCULAR | Status: DC | PRN
  Administered 2020-09-04: 80 mg via INTRAVENOUS

## 2020-09-04 MED ORDER — PROMETHAZINE HCL 25 MG/ML IJ SOLN: 6.2500 mg | INTRAMUSCULAR | Status: DC | PRN

## 2020-09-04 MED ORDER — METHYLENE BLUE 0.5 % INJ SOLN
INTRAVENOUS | Status: AC
  Filled 2020-09-04: qty 10

## 2020-09-04 MED ORDER — PROPOFOL 10 MG/ML IV BOLUS
INTRAVENOUS | Status: AC
  Filled 2020-09-04: qty 40

## 2020-09-04 MED ORDER — DOXYCYCLINE HYCLATE 50 MG PO CAPS: 100.0000 mg | ORAL_CAPSULE | Freq: Two times a day (BID) | ORAL | 0 refills | Status: AC

## 2020-09-04 MED ORDER — FENTANYL CITRATE (PF) 250 MCG/5ML IJ SOLN
INTRAMUSCULAR | Status: DC | PRN
  Administered 2020-09-04 (×2): 25 ug via INTRAVENOUS
  Administered 2020-09-04 (×2): 50 ug via INTRAVENOUS

## 2020-09-04 MED ORDER — ACETAMINOPHEN 10 MG/ML IV SOLN
INTRAVENOUS | Status: DC | PRN
  Administered 2020-09-04: 1000 mg via INTRAVENOUS

## 2020-09-04 MED ORDER — OXYCODONE HCL 5 MG PO TABS: 5.0000 mg | ORAL_TABLET | Freq: Four times a day (QID) | ORAL | 0 refills | Status: AC | PRN

## 2020-09-04 MED ORDER — HYDROMORPHONE HCL 1 MG/ML IJ SOLN
INTRAMUSCULAR | Status: AC
  Filled 2020-09-04: qty 1

## 2020-09-04 MED ORDER — SODIUM CHLORIDE 0.9 % IV BOLUS
1000.0000 mL | Freq: Once | INTRAVENOUS | Status: AC
  Administered 2020-09-04: 1000 mL via INTRAVENOUS

## 2020-09-04 MED ORDER — ROCURONIUM BROMIDE 10 MG/ML (PF) SYRINGE
PREFILLED_SYRINGE | INTRAVENOUS | Status: DC | PRN
  Administered 2020-09-04 (×2): 20 mg via INTRAVENOUS
  Administered 2020-09-04: 60 mg via INTRAVENOUS

## 2020-09-04 MED ORDER — MORPHINE SULFATE (PF) 4 MG/ML IV SOLN
4.0000 mg | Freq: Once | INTRAVENOUS | Status: AC
  Administered 2020-09-04: 4 mg via INTRAVENOUS
  Filled 2020-09-04: qty 1

## 2020-09-04 MED ORDER — METRONIDAZOLE 500 MG PO TABS: 500.0000 mg | ORAL_TABLET | Freq: Two times a day (BID) | ORAL | 0 refills | Status: AC

## 2020-09-04 MED ORDER — MIDAZOLAM HCL 2 MG/2ML IJ SOLN
INTRAMUSCULAR | Status: DC | PRN
  Administered 2020-09-04: 2 mg via INTRAVENOUS

## 2020-09-04 MED ORDER — ONDANSETRON 4 MG PO TBDP: 4.0000 mg | ORAL_TABLET | Freq: Three times a day (TID) | ORAL | 0 refills | Status: DC | PRN

## 2020-09-04 MED ORDER — PROPOFOL 10 MG/ML IV BOLUS
INTRAVENOUS | Status: DC | PRN
  Administered 2020-09-04: 150 mg via INTRAVENOUS
  Administered 2020-09-04: 30 mg via INTRAVENOUS

## 2020-09-04 MED ORDER — FENTANYL CITRATE (PF) 250 MCG/5ML IJ SOLN
INTRAMUSCULAR | Status: AC
  Filled 2020-09-04: qty 5

## 2020-09-04 MED ORDER — OXYCODONE HCL 5 MG/5ML PO SOLN
5.0000 mg | Freq: Once | ORAL | Status: DC | PRN
Start: 2020-09-04 — End: 2020-09-05

## 2020-09-04 SURGICAL SUPPLY — 37 items
APPLICATOR COTTON TIP 6 STRL (MISCELLANEOUS) ×1 IMPLANT
APPLICATOR COTTON TIP 6IN STRL (MISCELLANEOUS) ×2 IMPLANT
CABLE HIGH FREQUENCY MONO STRZ (ELECTRODE) IMPLANT
CATH ROBINSON RED A/P 16FR (CATHETERS) ×2 IMPLANT
CNTNR URN SCR LID CUP LEK RST (MISCELLANEOUS) ×1 IMPLANT
CONT SPEC 4OZ STRL OR WHT (MISCELLANEOUS) ×2
DERMABOND ADHESIVE PROPEN (GAUZE/BANDAGES/DRESSINGS) ×1
DERMABOND ADVANCED (GAUZE/BANDAGES/DRESSINGS) ×1
DERMABOND ADVANCED .7 DNX12 (GAUZE/BANDAGES/DRESSINGS) ×1 IMPLANT
DERMABOND ADVANCED .7 DNX6 (GAUZE/BANDAGES/DRESSINGS) ×1 IMPLANT
DRSG OPSITE POSTOP 3X4 (GAUZE/BANDAGES/DRESSINGS) IMPLANT
DURAPREP 26ML APPLICATOR (WOUND CARE) ×2 IMPLANT
FILTER SMOKE EVAC LAPAROSHD (FILTER) IMPLANT
GLOVE SURG ENC MOIS LTX SZ7 (GLOVE) ×2 IMPLANT
GLOVE SURG UNDER POLY LF SZ7 (GLOVE) ×4 IMPLANT
GOWN STRL REUS W/ TWL LRG LVL3 (GOWN DISPOSABLE) ×3 IMPLANT
GOWN STRL REUS W/TWL LRG LVL3 (GOWN DISPOSABLE) ×6
KIT TURNOVER KIT B (KITS) ×2 IMPLANT
LIGASURE VESSEL 5MM BLUNT TIP (ELECTROSURGICAL) IMPLANT
MANIPULATOR UTERINE 4.5 ZUMI (MISCELLANEOUS) ×2 IMPLANT
NS IRRIG 1000ML POUR BTL (IV SOLUTION) ×2 IMPLANT
PACK LAPAROSCOPY BASIN (CUSTOM PROCEDURE TRAY) ×2 IMPLANT
PACK TRENDGUARD 450 HYBRID PRO (MISCELLANEOUS) IMPLANT
POUCH SPECIMEN RETRIEVAL 10MM (ENDOMECHANICALS) IMPLANT
PROTECTOR NERVE ULNAR (MISCELLANEOUS) ×4 IMPLANT
SCISSORS LAP 5X35 DISP (ENDOMECHANICALS) ×2 IMPLANT
SET IRRIG TUBING LAPAROSCOPIC (IRRIGATION / IRRIGATOR) ×2 IMPLANT
SET TUBE SMOKE EVAC HIGH FLOW (TUBING) ×2 IMPLANT
SLEEVE ENDOPATH XCEL 5M (ENDOMECHANICALS) ×4 IMPLANT
SOLUTION ELECTROLUBE (MISCELLANEOUS) IMPLANT
SUT VICRYL 0 UR6 27IN ABS (SUTURE) ×2 IMPLANT
SUT VICRYL 4-0 PS2 18IN ABS (SUTURE) ×2 IMPLANT
TOWEL GREEN STERILE FF (TOWEL DISPOSABLE) ×4 IMPLANT
TRENDGUARD 450 HYBRID PRO PACK (MISCELLANEOUS)
TROCAR BALLN 12MMX100 BLUNT (TROCAR) IMPLANT
TROCAR XCEL NON-BLD 5MMX100MML (ENDOMECHANICALS) ×2 IMPLANT
WARMER LAPAROSCOPE (MISCELLANEOUS) ×2 IMPLANT

## 2020-09-04 NOTE — Transfer of Care (Signed)
Immediate Anesthesia Transfer of Care Note  Patient: Diana Proctor  Procedure(s) Performed: LAPAROSCOPIC OVARIAN CYSTECTOMY (Left)  Patient Location: PACU  Anesthesia Type:General  Level of Consciousness: drowsy  Airway & Oxygen Therapy: Patient Spontanous Breathing  Post-op Assessment: Report given to RN and Post -op Vital signs reviewed and stable  Post vital signs: Reviewed and stable  Last Vitals:  Vitals Value Taken Time  BP    Temp    Pulse 94 09/04/20 1900  Resp 21 09/04/20 1900  SpO2 100 % 09/04/20 1900  Vitals shown include unvalidated device data.  Last Pain:  Vitals:   09/04/20 1151  TempSrc: Oral  PainSc: 7          Complications: No notable events documented.

## 2020-09-04 NOTE — Anesthesia Postprocedure Evaluation (Signed)
Anesthesia Post Note  Patient: Diana Proctor  Procedure(s) Performed: LAPAROSCOPIC OVARIAN CYSTECTOMY (Left)     Patient location during evaluation: PACU Anesthesia Type: General Level of consciousness: awake and alert Pain management: pain level controlled Vital Signs Assessment: post-procedure vital signs reviewed and stable Respiratory status: spontaneous breathing, nonlabored ventilation, respiratory function stable and patient connected to nasal cannula oxygen Cardiovascular status: blood pressure returned to baseline and stable Postop Assessment: no apparent nausea or vomiting Anesthetic complications: no   No notable events documented.  Last Vitals:  Vitals:   09/04/20 1932 09/04/20 1947  BP: (!) 102/56 (!) 104/56  Pulse: 76 72  Resp: (!) 21 18  Temp:  36.5 C  SpO2: 95% 95%    Last Pain:  Vitals:   09/04/20 1947  TempSrc:   PainSc: Asleep                 Cecile Hearing

## 2020-09-04 NOTE — Anesthesia Procedure Notes (Signed)
Procedure Name: Intubation Date/Time: 09/04/2020 4:43 PM Performed by: Rande Brunt, CRNA Pre-anesthesia Checklist: Patient identified, Emergency Drugs available, Suction available and Patient being monitored Patient Re-evaluated:Patient Re-evaluated prior to induction Oxygen Delivery Method: Circle System Utilized Preoxygenation: Pre-oxygenation with 100% oxygen Induction Type: IV induction Ventilation: Mask ventilation without difficulty Laryngoscope Size: Mac and 3 Grade View: Grade I Tube type: Oral Tube size: 7.0 mm Number of attempts: 1 Airway Equipment and Method: Stylet Placement Confirmation: ETT inserted through vocal cords under direct vision, positive ETCO2 and breath sounds checked- equal and bilateral Secured at: 22 cm Tube secured with: Tape Dental Injury: Teeth and Oropharynx as per pre-operative assessment

## 2020-09-04 NOTE — ED Triage Notes (Signed)
Pt c/o lower abdominal pain that started 45 mins pta. Endorses nausea, denies vomiting/diarrhea, urinary symptoms.

## 2020-09-04 NOTE — ED Notes (Signed)
Pt in ultrasound

## 2020-09-04 NOTE — ED Provider Notes (Signed)
Care assumed from [facility], please see her note for full details, but in brief Diana Proctor is a 20 y.o. female who presents with severe abdominal pain that began at 1 AM and awoke patient from sleep, pain to the pelvic area that felt like a cramp but more severe than anything she has experienced previously.  Does have known history of ovarian cyst on the left.  Saw OB/GYN yesterday and was scheduled for outpatient ultrasound.  Labs have been reassuring, patient not sexually active, no significant discharge on exam or concern for PID.  Ultrasound pending at shift change to rule out ovarian torsion.   Physical Exam  BP (!) 107/54   Pulse 68   Temp 98.1 F (36.7 C) (Oral)   Resp 20   LMP  (LMP Unknown)   SpO2 100%   Physical Exam Vitals and nursing note reviewed.  Constitutional:      General: She is not in acute distress.    Appearance: Normal appearance. She is well-developed. She is not ill-appearing or diaphoretic.  HENT:     Head: Normocephalic and atraumatic.  Eyes:     General:        Right eye: No discharge.        Left eye: No discharge.  Pulmonary:     Effort: Pulmonary effort is normal. No respiratory distress.  Abdominal:     Comments: Abdomen soft, nondistended, bowel sounds present throughout, there is some generalized mild abdominal tenderness, worse in the lower abdomen.  Neurological:     Mental Status: She is alert and oriented to person, place, and time.     Coordination: Coordination normal.  Psychiatric:        Mood and Affect: Mood normal.        Behavior: Behavior normal.    ED Course/Procedures   Labs Reviewed  WET PREP, GENITAL - Abnormal; Notable for the following components:      Result Value   WBC, Wet Prep HPF POC MANY (*)    All other components within normal limits  COMPREHENSIVE METABOLIC PANEL - Abnormal; Notable for the following components:   CO2 21 (*)    Glucose, Bld 102 (*)    All other components within normal limits   CBC - Abnormal; Notable for the following components:   WBC 13.0 (*)    MCV 101.0 (*)    All other components within normal limits  LIPASE, BLOOD  URINALYSIS, ROUTINE W REFLEX MICROSCOPIC  I-STAT BETA HCG BLOOD, ED ([facility], [facility], [facility] ONLY)  GC/CHLAMYDIA PROBE AMP ([place]) NOT AT [facility]   US PELVIC COMPLETE W TRANSVAGINAL AND TORSION R/O  Result Date: 09/04/2020 CLINICAL DATA:  Pelvic pain.  History of left ovarian cyst. EXAM: TRANSABDOMINAL AND TRANSVAGINAL ULTRASOUND OF PELVIS DOPPLER ULTRASOUND OF OVARIES TECHNIQUE: Both transabdominal and transvaginal ultrasound examinations of the pelvis were performed. Transabdominal technique was performed for global imaging of the pelvis including uterus, ovaries, adnexal regions, and pelvic cul-de-sac. It was necessary to proceed with endovaginal exam following the transabdominal exam to visualize the endometrium, uterus, bilateral ovaries, bilateral adnexae. Color and duplex Doppler ultrasound was utilized to evaluate blood flow to the ovaries. COMPARISON:  None. FINDINGS: Uterus Measurements: 10.7 x 3.7 x 5.1 cm = volume: 105 mL. Trace volume free fluid within the endometrial canal. A T-shaped intrauterine device is noted appropriately position within the endometrial canal. No fibroids or other mass visualized. Endometrium Thickness: 7 mm.  No focal abnormality visualized. Right ovary Measurements: 4.3  x 2.8 x 3.6 cm = volume: 23 mL. There is a cystic lesion measuring 2 cm. Normal appearance/no adnexal mass. Left ovary Measurements: 6.4 x 4.6 x 5.8 cm = volume: 90 mL. There is a 3.7 x 4.4 x 4.8 cm left ovarian cystic lesion with associated multiple irregular thick septations (ultrasound 1-11). No definite mural nodularity or vascularity. Otherwise normal appearance of the left ovary. Pulsed Doppler evaluation of both ovaries demonstrates normal low-resistance arterial and venous waveforms. Other findings No abnormal free fluid. IMPRESSION: 1. Complex 4.8 cm left  ovarian cystic lesion with associated multiple irregular thick septations. Recommend gynecologic consultation. 2. No findings to suggest ovarian torsion/ischemia; however, this does not exclude ovarian torsion as this can be an intermittent finding. 3. Well-positioned T-shaped intrauterine device. Electronically Signed   By: Tish Frederickson M.D.   On: 09/04/2020 06:45     Procedures  MDM   Ultrasound shows a complex 4.8 left ovarian cystic lesion with associated multiple irregular thick septations, recommended gynecologic consultation.  No findings currently to suggest ovarian torsion or ischemia, however radiologist notes this does not completely exclude torsion as this can be intermittent.  IUD is well-positioned.  Patient has some more mild pain still present, will give some additional pain control.  Patient was scheduled to have an outpatient ultrasound done later today, but will have her call to speak with her gynecologist this morning.   I discussed case with Dr. Juliene Pina, patient's OB/GYN who saw her in the clinic yesterday, given patient's continued pain she is concerned, and in particular given complex appearance of cyst on ultrasound that has been persistent now for several months.  Dr. Juliene Pina spoke with patient and patient's mother over the phone as well, and after discussing together they decided patient would prefer to proceed with surgery today for removal of ovarian cyst if possible.  Dr. Juliene Pina was initially going to have her colleague try and do the surgery, but she called back stating that she has posted the patient for surgery at [facility] today at 1230 and will be doing the procedure herself.  She will see the patient prior to surgery in the preop area.  I discussed this plan with the patient and mom who expressed understanding and agreement.  COVID screening test ordered for the OR.    Dartha Lodge, PA-C 09/04/20 1006    Glynn Octave, MD 09/04/20 [phone]

## 2020-09-04 NOTE — Anesthesia Preprocedure Evaluation (Signed)
Anesthesia Evaluation  Patient identified by MRN, date of birth, ID band Patient awake    Reviewed: Allergy & Precautions, NPO status , Patient's Chart, lab work & pertinent test results  Airway Mallampati: II  TM Distance: >3 FB Neck ROM: Full    Dental no notable dental hx.    Pulmonary neg pulmonary ROS,    Pulmonary exam normal breath sounds clear to auscultation       Cardiovascular negative cardio ROS Normal cardiovascular exam Rhythm:Regular Rate:Normal     Neuro/Psych  Headaches, Depression negative psych ROS   GI/Hepatic negative GI ROS, Neg liver ROS,   Endo/Other  negative endocrine ROS  Renal/GU negative Renal ROS  negative genitourinary   Musculoskeletal negative musculoskeletal ROS (+)   Abdominal   Peds negative pediatric ROS (+)  Hematology negative hematology ROS (+)   Anesthesia Other Findings   Reproductive/Obstetrics negative OB ROS                             Anesthesia Physical Anesthesia Plan  ASA: 2 and emergent  Anesthesia Plan: General   Post-op Pain Management:    Induction: Intravenous  PONV Risk Score and Plan: 3 and Ondansetron, Dexamethasone, Midazolam and Treatment may vary due to age or medical condition  Airway Management Planned: Oral ETT  Additional Equipment:   Intra-op Plan:   Post-operative Plan: Extubation in OR  Informed Consent: I have reviewed the patients History and Physical, chart, labs and discussed the procedure including the risks, benefits and alternatives for the proposed anesthesia with the patient or authorized representative who has indicated his/her understanding and acceptance.     Dental advisory given  Plan Discussed with: CRNA  Anesthesia Plan Comments:         Anesthesia Quick Evaluation

## 2020-09-04 NOTE — Op Note (Signed)
Preoperative diagnosis: Left ovarian cyst with acute pain  Postoperative diagnosis: Same, suspect PID Procedure: Operative Laparoscopy, right ovarian cyst drainage and remove of cyst wall, lysis of tubo-ovarian adhesions  Surgeon: Shea Evans, MD Assistants: Dorisann Frames, CNM  Anesthesia Gen. Endotracheal IV fluids LR 1500 cc EBL minimal 25 cc Urine clear (straight cath pre-op) 50 cc Complications none Disposition PACU and home Specimens ovarian cyst wall   Procedure Patient presents for diagnostic possible operative laparoscopy. Risk and complications of surgery including infection, bleeding, damage to internal organs, other complications including pneumonia, VTE were reviewed. Patient voiced understanding. Informed written consent was obtained and patient was brought to the operating room with IV running. 2 gm Ancef given.  Timeout was carried out. She underwent general anesthesia without difficulty and was given dorsal lithotomy position. She was prepped and draped in standard fashion. Bladder was emptied with straight catheter. Speculum was placed anterior lip of cervix was grasped with tenaculum and Hulka manipulator was inserted and secured to cervix. Gloves, gown changed attention was focused on the abdomen.  A  5 mm curved incision was made at the lower edge of the umbilicus after marcaine injection.Incision was carried down to the fascia was incised. Stay suture of 0-Vicryl placed on fascia and 5 mm trocar introduced. Insufflation was begun with CO2 and a 0 laparoscope was introduced. Entry was uneventful. Trendelenburg given. Two 5 mm trocars inserted, one in each lower quadrants under vision after marcaine injection.  Left ovary was enlarged at 6 cm, left tube was swollen and left tube and ovary were adherent to each other and left ovarian fossa and some filmy adhesions to colon. Yellow turbid fluid was noted in the pelvis and around presacral area as she was in trendelenburg. Suction  irrigation of this fluid done. Hydro dissection performed to released tubo-ovarian filmy adhesions. Ovary was still adherent to ovarian fossa. Then with endo scissors, ovarian wall was incised about 3 cm and cystic area punctured. It drained copious yellow pus like fluid and was suctioned and then thoroughly irrigated.What appeared to be the cyst wall was very thick and after several attempts at removing cyst wall, it appeared that it was ovarian tissue. I advanced scope to assess inside the ovary and there was no obvious cyst wall, no other material like hair noted. I took a biopsy of what was originally felt to be cyst wall but was likely ovarian stroma. Hemostasis obtained with gentle cautery use.  Right tube and ovary were normal and cul de sac was normal. No endometriosis noted. Upper abdomen noted normal appendix on the right and normal liver and stomach, no peri-hepatic lesions noted.  Instruments and trocars removed after instilling 20 cc of remaining 0.25%  Marcaine in the peritoneal cavity, pneumoperitoneum was released. Fascial incision closed with single stitch of 0-Vicryl. The skin at all incisions approximated using 4-0 Vicryl in subcuticular fashion. Dermabond was applied at the incision. The uterine manipulator was removed. Hemostasis was excellent.  All counts were correct x 2.  Patient was also given Levaquin 500mg  IV in OR due to purulent fluid that was noted.  Patient brought out to the recovery room after extubation in stable condition. Plan is to discharge home from recovery room. Surgical findings were discussed with patient's family.  Followup with Dr. in office in 2 weeks.

## 2020-09-04 NOTE — Discharge Instructions (Addendum)
You were seen in the Er today for pelvic pain.  Your labs were reassuring Your ultrasound   - Naproxen is a nonsteroidal anti-inflammatory medication that will help with pain and swelling. Be sure to take this medication as prescribed with food, 1 pill every 12 hours,  It should be taken with food, as it can cause stomach upset, and more seriously, stomach bleeding. Do not take other nonsteroidal anti-inflammatory medications with this such as Advil, Motrin, Aleve, Mobic, Goodie Powder, or Motrin.    - Zofran-  take every 8 hours as needed for nausea.   You make take Tylenol per over the counter dosing with these medications.   We have prescribed you new medication(s) today. Discuss the medications prescribed today with your pharmacist as they can have adverse effects and interactions with your other medicines including over the counter and prescribed medications. Seek medical evaluation if you start to experience new or abnormal symptoms after taking one of these medicines, seek care immediately if you start to experience difficulty breathing, feeling of your throat closing, facial swelling, or rash as these could be indications of a more serious allergic reaction  Please follow-up with your OB/GYN within 1 week.  Return to the ER for new or worsening symptoms including but not limited to new or worsening pain, inability to keep fluids down, fever, or any other concerns.

## 2020-09-04 NOTE — ED Provider Notes (Signed)
[facility] EMERGENCY DEPARTMENT Provider Note   CSN: [id number] Arrival date & time: 09/04/20  0145     History Chief Complaint  Patient presents with   Abdominal Pain    Diana Proctor is a 20 y.o. female with a hx of ehlers danlos syndrome and migraines who presents to the ED with complaints of abdominal pain that began at 1AM. Woke from sleep with pain to the pelvic area, felt like a cramp but more severe than anything she has experienced previously, took advil with some improvement, current pain is a 4/10 in severity and feels more like a cramp. Associated nausea. LMP was 08/21/20. Denies fever, vomiting, diarrhea, dysuria, or vaginal bleeding/discharge. Not currently sexually active. Hx of left sided ovarian cyst, saw GYN yesterday, scheduled for [place] today.   HPI     Past Medical History:  Diagnosis Date   Headache     Patient Active Problem List   Diagnosis Date Noted   Fever of unknown origin 02/28/2020   Disequilibrium 05/17/2019   Migraine without aura and without status migrainosus, not intractable 04/25/2019   Episodic tension-type headache, not intractable 04/25/2019   Central positional vertigo 04/25/2019   Orthostatic hypotension 03/02/2016   Closed head injury without loss of consciousness 12/31/2015   Posttraumatic headache 12/31/2015   Post concussion syndrome 12/31/2015   Adjustment disorder with mixed anxiety and depressed mood 01/08/2013   Acne 01/08/2013   Sleep disturbance 01/08/2013    Past Surgical History:  Procedure Laterality Date   NO PAST SURGERIES       OB History   No obstetric history on file.     Family History  Problem Relation Age of Onset   Depression Mother    Alzheimer's disease Maternal Grandmother    Heart attack Maternal Grandfather     Social History   Tobacco Use   Smoking status: Never   Smokeless tobacco: Never  Substance Use Topics   Alcohol use: Yes    Comment: social   Drug use: Never     Home Medications Prior to Admission medications   Medication Sig Start Date End Date Taking? Authorizing Provider  hydrOXYzine (ATARAX/VISTARIL) 25 MG tablet Take 25 mg by mouth daily as needed for anxiety. 02/05/20  Yes [provider]  ibuprofen (ADVIL) 200 MG tablet Take 600 mg by mouth every 6 (six) hours as needed for headache or moderate pain.   Yes [provider]  Iron, Ferrous Sulfate, 325 (65 Fe) MG TABS Take 325 mg by mouth 2 (two) times daily. Patient taking differently: Take 325 mg by mouth at bedtime. 03/04/20  Yes Duke Salvia, MD  Melatonin 1 MG TABS Take 1 mg by mouth at bedtime as needed (sleep).   Yes [provider]  sertraline (ZOLOFT) 50 MG tablet Take 75 mg by mouth at bedtime. 04/22/20  Yes [provider]    Allergies    Patient has no known allergies.  Review of Systems   Review of Systems  Constitutional:  Negative for chills and fever.  Respiratory:  Negative for shortness of breath.   Cardiovascular:  Negative for chest pain.  Gastrointestinal:  Positive for abdominal pain and nausea. Negative for blood in stool, constipation, diarrhea and vomiting.  Genitourinary:  Positive for pelvic pain. Negative for dysuria, vaginal bleeding and vaginal discharge.  Neurological:  Negative for syncope.  All other systems reviewed and are negative.  Physical Exam Updated Vital Signs BP 117/60   Pulse 70  Temp 98.1 F (36.7 C) (Oral)   Resp 18   LMP  (LMP Unknown)   SpO2 99%   Physical Exam Vitals and nursing note reviewed. Exam conducted with a chaperone present.  Constitutional:      General: She is not in acute distress.    Appearance: She is well-developed. She is not toxic-appearing.  HENT:     Head: Normocephalic and atraumatic.  Eyes:     General:        Right eye: No discharge.        Left eye: No discharge.     Conjunctiva/sclera: Conjunctivae normal.  Cardiovascular:     Rate and Rhythm: Normal rate and  regular rhythm.  Pulmonary:     Effort: Pulmonary effort is normal. No respiratory distress.     Breath sounds: Normal breath sounds. No wheezing, rhonchi or rales.  Abdominal:     General: There is no distension.     Palpations: Abdomen is soft.     Tenderness: There is abdominal tenderness in the right lower quadrant, suprapubic area and left lower quadrant. There is no guarding or rebound.  Genitourinary:    Comments: No external lesions. Minimal white discharge present.  Diffuse tenderness throughout bimanual exam.  Musculoskeletal:     Cervical back: Neck supple.  Skin:    General: Skin is warm and dry.     Findings: No rash.  Neurological:     Mental Status: She is alert.     Comments: Clear speech.   Psychiatric:        Behavior: Behavior normal.    ED Results / Procedures / Treatments   Labs (all labs ordered are listed, but only abnormal results are displayed) Labs Reviewed  WET PREP, GENITAL - Abnormal; Notable for the following components:      Result Value   WBC, Wet Prep HPF POC MANY (*)    All other components within normal limits  COMPREHENSIVE METABOLIC PANEL - Abnormal; Notable for the following components:   CO2 21 (*)    Glucose, Bld 102 (*)    All other components within normal limits  CBC - Abnormal; Notable for the following components:   WBC 13.0 (*)    MCV 101.0 (*)    All other components within normal limits  LIPASE, BLOOD  URINALYSIS, ROUTINE W REFLEX MICROSCOPIC  I-STAT BETA HCG BLOOD, ED ([facility], [facility], [facility] ONLY)  GC/CHLAMYDIA PROBE AMP ([facility]) NOT AT [facility]    EKG None  Radiology No results found.  Procedures Procedures   Medications Ordered in ED Medications  sodium chloride 0.9 % bolus 1,000 mL (1,000 mLs Intravenous New Bag/Given 09/04/20 0506)  ondansetron (ZOFRAN) injection 4 mg (4 mg Intravenous Given 09/04/20 0507)  morphine 4 MG/ML injection 4 mg (4 mg Intravenous Given 09/04/20 3825)    ED Course  I have reviewed the  triage vital signs and the nursing notes.  Pertinent labs & imaging results that were available during my care of the patient were reviewed by me and considered in my medical decision making (see chart for details).    MDM Rules/Calculators/A&P                          Patient presents to the ED with complaints of lower abdominal/pelvic pain.  Nontoxic, vitals w/o significant abnormality. Lower abdominal/suprapubic tenderness, no peritoneal signs, diffuse tenderness on bimanual exam.    Additional history obtained:  Additional history obtained from chart  review & nursing note review.   Lab Tests:  I Ordered, reviewed, and interpreted labs, which included:  CBC: mild leukocytosis.  CMP: fairly unremarkable.  Lipase: WNL Preg test: negative UA: No UTI.  Wet prep: Many wbcs, no trich, yeast, or BV.   Morphine/zofran & fluids ordered for supportive care.   Imaging Studies ordered:  I ordered imaging studies which included Pelvic US   Not currently sexually active, no significant discharge only minimal- doubt PID. Preg test negative- doubt ectopic.  06:35: Patient care signed out to Jodi Geralds PA-C at change of shift pending [place] & disposition. If no acute surgical process anticipate discharge home.   Portions of this note were generated with Scientist, clinical (histocompatibility and immunogenetics). Dictation errors may occur despite best attempts at proofreading.  Final Clinical Impression(s) / ED Diagnoses Final diagnoses:  Pelvic pain    Rx / DC Orders ED Discharge Orders     None        Cherly Anderson, PA-C 09/04/20 [id number]    Glynn Octave, MD 09/04/20 [phone]

## 2020-09-04 NOTE — H&P (Signed)
Diana Proctor is an 20 y.o. female who has been in the ER sicne 1 am today for LLQ pain. Acute worsening last night. Know left ovarian cyst noted at school clinic and was going to see me today for office sono for f/up.   Pelvic sono was done through school for pelvic pain, cramps and since IUD string was not seen on exam. Sono noted IUD in good location but also reported a left ovarian cyst, about 5 cm per pt- we dont have report. Pt has had intermittent LLQ pain for sometime, thought it was period cramps. Pain still present on left, it doesn't move anywhere and never resolves. Occasionally takes Naproxen with period and occasionally here and there for pain. Has not stopped her from doing daily work. Denies pain with ic specifically on left. Reports neg STD testing within last 1 month and declined yesterday and no new partner.  Yesterday she reported having a Paragard IUD since May'19. Menses are regular and are heavier since IUD. But wants to keep it fo bc.  In ER she had complete evaluation and was about to be discharged but pain came back and needed Toradol again.  Sono- c/w complex left ovarian cyst but no obvious s/s of torsion per verbal report from ER provider   No LMP recorded (lmp unknown).    Past Medical History:  Diagnosis Date   Headache     Past Surgical History:  Procedure Laterality Date   NO PAST SURGERIES      Family History  Problem Relation Age of Onset   Depression Mother    Alzheimer's disease Maternal Grandmother    Heart attack Maternal Grandfather     Social History:  reports that she has never smoked. She has never used smokeless tobacco. She reports current alcohol use. She reports that she does not use drugs.  Allergies: No Known Allergies  Medications Prior to Admission  Medication Sig Dispense Refill Last Dose   hydrOXYzine (ATARAX/VISTARIL) 25 MG tablet Take 25 mg by mouth daily as needed for anxiety.   unk   ibuprofen (ADVIL) 200 MG tablet Take  600 mg by mouth every 6 (six) hours as needed for headache or moderate pain.   09/03/2020   Iron, Ferrous Sulfate, 325 (65 Fe) MG TABS Take 325 mg by mouth 2 (two) times daily. (Patient taking differently: Take 325 mg by mouth at bedtime.) 30 tablet  09/03/2020   Melatonin 1 MG TABS Take 1 mg by mouth at bedtime as needed (sleep).   09/03/2020   sertraline (ZOLOFT) 50 MG tablet Take 75 mg by mouth at bedtime.   09/03/2020    Review of Systems  Blood pressure 114/71, pulse 79, temperature 98 F (36.7 C), resp. rate (!) 22, SpO2 100 %. Physical Exam Physical exam:  A&O x 3, no acute distress. Pleasant HEENT neg, no thyromegaly Lungs CTA bilat CV RRR, S1S2 normal Abdo soft, non tender, non acute Extr no edema/ tenderness Pelvic done in office yesterday, normal uterus, cervix, right ovary, Left adnexa with 5 cm cystic mass   Results for orders placed or performed during the hospital encounter of 09/04/20 (from the past 24 hour(s))  Lipase, blood     Status: None   Collection Time: 09/04/20  1:52 AM  Result Value Ref Range   Lipase 38 11 - 51 U/L  Comprehensive metabolic panel     Status: Abnormal   Collection Time: 09/04/20  1:52 AM  Result Value Ref Range   Sodium  136 135 - 145 mmol/L   Potassium 3.9 3.5 - 5.1 mmol/L   Chloride 105 98 - 111 mmol/L   CO2 21 (L) 22 - 32 mmol/L   Glucose, Bld 102 (H) 70 - 99 mg/dL   BUN 11 6 - 20 mg/dL   Creatinine, Ser 0.09 0.44 - 1.00 mg/dL   Calcium 9.7 8.9 - 23.3 mg/dL   Total Protein 7.1 6.5 - 8.1 g/dL   Albumin 4.5 3.5 - 5.0 g/dL   AST 18 15 - 41 U/L   ALT 11 0 - 44 U/L   Alkaline Phosphatase 49 38 - 126 U/L   Total Bilirubin 0.9 0.3 - 1.2 mg/dL   GFR, Estimated >00 >76 mL/min   Anion gap 10 5 - 15  CBC     Status: Abnormal   Collection Time: 09/04/20  1:52 AM  Result Value Ref Range   WBC 13.0 (H) 4.0 - 10.5 K/uL   RBC 4.08 3.87 - 5.11 MIL/uL   Hemoglobin 13.3 12.0 - 15.0 g/dL   HCT 22.6 33.3 - 54.5 %   MCV 101.0 (H) 80.0 - 100.0 fL    MCH 32.6 26.0 - 34.0 pg   MCHC 32.3 30.0 - 36.0 g/dL   RDW 62.5 63.8 - 93.7 %   Platelets 280 150 - 400 K/uL   nRBC 0.0 0.0 - 0.2 %  I-Stat beta hCG blood, ED     Status: None   Collection Time: 09/04/20  2:08 AM  Result Value Ref Range   I-stat hCG, quantitative <5.0 <5 mIU/mL   Comment 3          Urinalysis, Routine w reflex microscopic Urine, Clean Catch     Status: None   Collection Time: 09/04/20  3:48 AM  Result Value Ref Range   Color, Urine YELLOW YELLOW   APPearance CLEAR CLEAR   Specific Gravity, Urine 1.017 1.005 - 1.030   pH 6.0 5.0 - 8.0   Glucose, UA NEGATIVE NEGATIVE mg/dL   Hgb urine dipstick NEGATIVE NEGATIVE   Bilirubin Urine NEGATIVE NEGATIVE   Ketones, ur NEGATIVE NEGATIVE mg/dL   Protein, ur NEGATIVE NEGATIVE mg/dL   Nitrite NEGATIVE NEGATIVE   Leukocytes,Ua NEGATIVE NEGATIVE  Wet prep, genital     Status: Abnormal   Collection Time: 09/04/20  4:53 AM  Result Value Ref Range   Yeast Wet Prep HPF POC NONE SEEN NONE SEEN   Trich, Wet Prep NONE SEEN NONE SEEN   Clue Cells Wet Prep HPF POC NONE SEEN NONE SEEN   WBC, Wet Prep HPF POC MANY (A) NONE SEEN   Sperm NONE SEEN   Resp Panel by RT-PCR (Flu A&B, Covid) Nasopharyngeal Swab     Status: None   Collection Time: 09/04/20  9:58 AM   Specimen: Nasopharyngeal Swab; Nasopharyngeal(NP) swabs in vial transport medium  Result Value Ref Range   SARS Coronavirus 2 by RT PCR NEGATIVE NEGATIVE   Influenza A by PCR NEGATIVE NEGATIVE   Influenza B by PCR NEGATIVE NEGATIVE    US PELVIC COMPLETE W TRANSVAGINAL AND TORSION R/O  Result Date: 09/04/2020 CLINICAL DATA:  Pelvic pain.  History of left ovarian cyst. EXAM: TRANSABDOMINAL AND TRANSVAGINAL ULTRASOUND OF PELVIS DOPPLER ULTRASOUND OF OVARIES TECHNIQUE: Both transabdominal and transvaginal ultrasound examinations of the pelvis were performed. Transabdominal technique was performed for global imaging of the pelvis including uterus, ovaries, adnexal regions, and pelvic  cul-de-sac. It was necessary to proceed with endovaginal exam following the transabdominal exam to visualize the  endometrium, uterus, bilateral ovaries, bilateral adnexae. Color and duplex Doppler ultrasound was utilized to evaluate blood flow to the ovaries. COMPARISON:  None. FINDINGS: Uterus Measurements: 10.7 x 3.7 x 5.1 cm = volume: 105 mL. Trace volume free fluid within the endometrial canal. A T-shaped intrauterine device is noted appropriately position within the endometrial canal. No fibroids or other mass visualized. Endometrium Thickness: 7 mm.  No focal abnormality visualized. Right ovary Measurements: 4.3 x 2.8 x 3.6 cm = volume: 23 mL. There is a cystic lesion measuring 2 cm. Normal appearance/no adnexal mass. Left ovary Measurements: 6.4 x 4.6 x 5.8 cm = volume: 90 mL. There is a 3.7 x 4.4 x 4.8 cm left ovarian cystic lesion with associated multiple irregular thick septations (ultrasound 1-11). No definite mural nodularity or vascularity. Otherwise normal appearance of the left ovary. Pulsed Doppler evaluation of both ovaries demonstrates normal low-resistance arterial and venous waveforms. Other findings No abnormal free fluid. IMPRESSION: 1. Complex 4.8 cm left ovarian cystic lesion with associated multiple irregular thick septations. Recommend gynecologic consultation. 2. No findings to suggest ovarian torsion/ischemia; however, this does not exclude ovarian torsion as this can be an intermittent finding. 3. Well-positioned T-shaped intrauterine device. Electronically Signed   By: Tish Frederickson M.D.   On: 09/04/2020 06:45    Assessment/Plan: 20 yo with acute LLQ pain with know left ovarian cyst. Possible intermittent torsion, due to severity of pain and non resolution of pain with pain meds in ER, recommend surgery Laparoscopy with ovarian cystectomy d/w pt and her mother.  Risks/complications of surgery reviewed incl infection, bleeding, damage to internal organs including bladder, bowels,  ureters, blood vessels, other risks from anesthesia, VTE and delayed complications of any surgery, complications in future surgery reviewed.  All in agreement, informed written consent signed  Robley Fries 09/04/2020, 11:35 AM

## 2020-09-05 ENCOUNTER — Encounter (HOSPITAL_COMMUNITY): Payer: Self-pay | Admitting: Obstetrics & Gynecology

## 2020-09-07 ENCOUNTER — Encounter (HOSPITAL_COMMUNITY): Payer: Self-pay | Admitting: Obstetrics & Gynecology

## 2020-09-08 LAB — SURGICAL PATHOLOGY

## 2020-09-15 DIAGNOSIS — G901 Familial dysautonomia [Riley-Day]: Secondary | ICD-10-CM | POA: Insufficient documentation

## 2020-09-17 ENCOUNTER — Encounter: Payer: Self-pay | Admitting: Internal Medicine

## 2020-09-17 ENCOUNTER — Ambulatory Visit (INDEPENDENT_AMBULATORY_CARE_PROVIDER_SITE_OTHER): Payer: BC Managed Care – PPO | Admitting: Internal Medicine

## 2020-09-17 ENCOUNTER — Other Ambulatory Visit: Payer: Self-pay

## 2020-09-17 VITALS — Ht 68.0 in | Wt 134.4 lb

## 2020-09-17 DIAGNOSIS — G901 Familial dysautonomia [Riley-Day]: Secondary | ICD-10-CM

## 2020-09-17 DIAGNOSIS — I951 Orthostatic hypotension: Secondary | ICD-10-CM | POA: Diagnosis not present

## 2020-09-17 NOTE — Progress Notes (Signed)
Patient ID: Diana Proctor, female   DOB: 03-07-2001, 20 y.o.   MRN: [id number]      Patient Care Team: Aliene Beams, MD as PCP - General (Family Medicine)   HPI  Diana Proctor is a 20 y.o. female Seen in follow-up for symptoms of dizziness and presyncope, orthostatic and heat intolerance with a remote history of concussive headaches and intercurrent viral/Covid infection  Functional status had been pretty good over the fall of her sophomore year at [employer]; the spring was better. The fevers from the fall are no more  Still with heat intolerance, and difficulty getting to classes  Struggles with metromenorrhagia, but is disinclined towards hormonal suppression.  Sleep is not great but better, struggling with night sweats.  Zoloft R x by her psychiatrist.    Today,the patient denies chest pain nocturnal dyspnea, orthopnea or peripheral edema.  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  Going to school for [employer] and tourism  She is on the [employer] yeah  Denies alcohol and marijuana  Date   Cr              K         Hgb   12/21 0.77 4.5          10.7   6/22  0.84 3.9           13.3    DATE TEST EF%   4/21 Echo (CE)  55-65 %    Records and Results Reviewed   Past Medical History:  Diagnosis Date   Headache     Past Surgical History:  Procedure Laterality Date   LAPAROSCOPIC OVARIAN CYSTECTOMY Left 09/04/2020   Procedure: LAPAROSCOPIC OVARIAN CYSTECTOMY;  Surgeon: Shea Evans, MD;  Location: [facility] OR;  Service: Gynecology;  Laterality: Left;   NO PAST SURGERIES      Current Meds  Medication Sig   hydrOXYzine (ATARAX/VISTARIL) 25 MG tablet Take 25 mg by mouth daily as needed for anxiety.   ibuprofen (ADVIL) 200 MG tablet Take 600 mg by mouth every 6 (six) hours as needed for  headache or moderate pain.   Iron, Ferrous Sulfate, 325 (65 Fe) MG TABS Take 325 mg by mouth 2 (two) times daily. (Patient taking differently: Take 325 mg by mouth at bedtime.)   Melatonin 1 MG TABS Take 1 mg by mouth at bedtime as needed (sleep).   sertraline (ZOLOFT) 50 MG tablet Take 75 mg by mouth at bedtime.    No Known Allergies  Review of Systems negative except from HPI and PMH  Physical Exam: Ht 5\' 8"  (1.727 m)   Wt 134 lb 6.4 oz (61 kg)   LMP 08/21/2020 (Exact Date)   SpO2 97%   BMI 20.44 kg/m  Well developed and nourished in no acute distress HENT normal Neck supple with JVP-  flat  Lungs Clear Regular rate and rhythm, no murmurs or gallops Abd-soft with active BS No Clubbing cyanosis No edema Skin-warm and dry A & Oriented  Grossly normal sensory and motor function  10/21/2020 @ 66   Assessment and  Plan:  Dysautonomia  Ehlers-Danlos/hypermobility  Arachnodactyly  Sleep disturbance  Anxiety   Generally improved-- doing well in school Struggling with coaching in the heat this summer  We discussed extensively the issues of  dysautonomia, the physiology of orthstasis and positional stress.  We discussed the role of salt and water repletion, the importance of exercise, often needing to be started in the recumbent position, and the awareness of triggers and the role of ambient heat and dehydration--  Discussed salt substitutes with a  goal of 2-3 gms of Sodium or 5-7 gm of sodium Chloride daily.  Rehydration solutions include Liquid IV, NUUN, TriOral, and pedialyte advanced care.  Salt tablets include plain salt tablets, SaltStick Vitassium, thermatabs amongst others.   Compression can also be of assistance    I,Stephanie Williams,acting as a scribe for Sherryl Manges, MD.,have documented all relevant documentation on the behalf of Sherryl Manges, MD,as directed by  Sherryl Manges, MD while in the presence of Sherryl Manges, MD.  I, Sherryl Manges, MD, have reviewed  all documentation for this visit. The documentation on 09/17/20 for the exam, diagnosis, procedures, and orders are all accurate and complete.

## 2020-09-17 NOTE — Patient Instructions (Signed)

## 2020-10-01 NOTE — Telephone Encounter (Signed)
ERROR

## 2020-10-12 DIAGNOSIS — L910 Hypertrophic scar: Secondary | ICD-10-CM | POA: Diagnosis not present

## 2020-10-12 DIAGNOSIS — L738 Other specified follicular disorders: Secondary | ICD-10-CM | POA: Diagnosis not present

## 2020-10-30 DIAGNOSIS — Z3040 Encounter for surveillance of contraceptives, unspecified: Secondary | ICD-10-CM | POA: Diagnosis not present

## 2020-10-30 DIAGNOSIS — N83202 Unspecified ovarian cyst, left side: Secondary | ICD-10-CM | POA: Diagnosis not present

## 2020-10-30 DIAGNOSIS — Z30432 Encounter for removal of intrauterine contraceptive device: Secondary | ICD-10-CM | POA: Diagnosis not present

## 2020-10-30 DIAGNOSIS — T8339XA Other mechanical complication of intrauterine contraceptive device, initial encounter: Secondary | ICD-10-CM | POA: Diagnosis not present

## 2020-11-24 DIAGNOSIS — L910 Hypertrophic scar: Secondary | ICD-10-CM | POA: Diagnosis not present

## 2020-11-24 DIAGNOSIS — N83202 Unspecified ovarian cyst, left side: Secondary | ICD-10-CM | POA: Diagnosis not present

## 2020-11-24 DIAGNOSIS — Z309 Encounter for contraceptive management, unspecified: Secondary | ICD-10-CM | POA: Diagnosis not present

## 2020-11-24 DIAGNOSIS — L738 Other specified follicular disorders: Secondary | ICD-10-CM | POA: Diagnosis not present

## 2021-01-08 DIAGNOSIS — Z3044 Encounter for surveillance of vaginal ring hormonal contraceptive device: Secondary | ICD-10-CM | POA: Diagnosis not present

## 2021-01-08 DIAGNOSIS — Z309 Encounter for contraceptive management, unspecified: Secondary | ICD-10-CM | POA: Diagnosis not present

## 2021-01-08 DIAGNOSIS — N83202 Unspecified ovarian cyst, left side: Secondary | ICD-10-CM | POA: Diagnosis not present

## 2021-01-18 DIAGNOSIS — J069 Acute upper respiratory infection, unspecified: Secondary | ICD-10-CM | POA: Diagnosis not present

## 2021-01-18 DIAGNOSIS — R059 Cough, unspecified: Secondary | ICD-10-CM | POA: Diagnosis not present

## 2021-01-18 DIAGNOSIS — L910 Hypertrophic scar: Secondary | ICD-10-CM | POA: Diagnosis not present

## 2021-01-18 DIAGNOSIS — Z03818 Encounter for observation for suspected exposure to other biological agents ruled out: Secondary | ICD-10-CM | POA: Diagnosis not present

## 2021-03-02 DIAGNOSIS — L910 Hypertrophic scar: Secondary | ICD-10-CM | POA: Diagnosis not present

## 2021-03-04 DIAGNOSIS — G4709 Other insomnia: Secondary | ICD-10-CM | POA: Diagnosis not present

## 2021-04-07 IMAGING — MR MR HEAD W/O CM
5 series · 48 of 48 positions shown · non-contrast
Comparison: None.

CLINICAL DATA: Headache

EXAM:
MRI HEAD WITHOUT CONTRAST
TECHNIQUE: Multiplanar, multiecho pulse sequences of the brain and surrounding
structures were obtained without intravenous contrast.

[Series 2: T1 · sagittal · 5.0mm · 0.45mm/px · 3 of 22 slices shown]
[im 1/22]
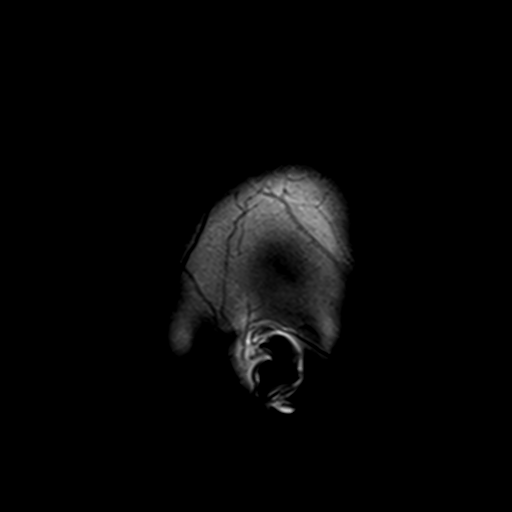
[im 11/22]
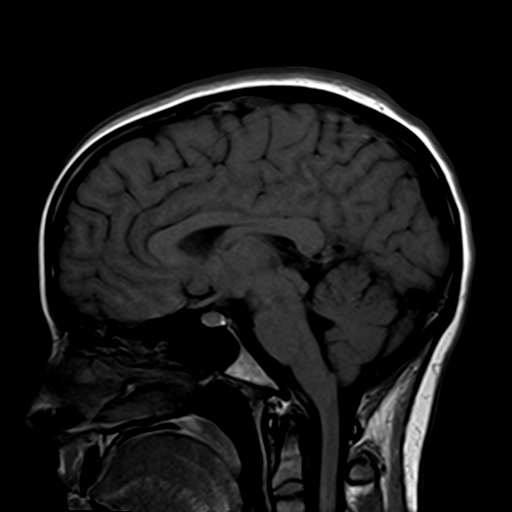
[im 22/22]
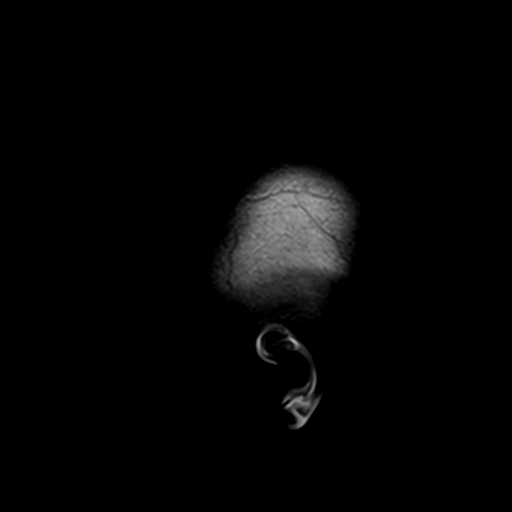

[Series 3: DWI · axial · 3.0mm · 1.80mm/px · z∈[-67,+80]mm · 13 of 100 slices shown (1 of 2)]
[im 1/100]
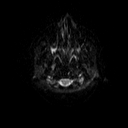
[im 9/100]
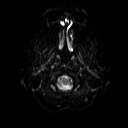
[im 17/100]
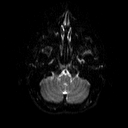
[im 25/100]
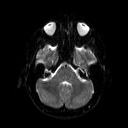
[im 34/100]
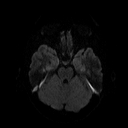
[im 42/100]
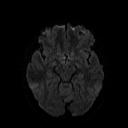
[im 50/100]
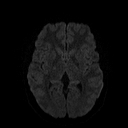
[im 58/100]
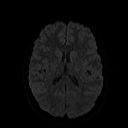
[im 67/100]
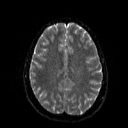
[im 75/100]
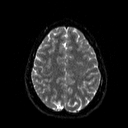
[im 83/100]
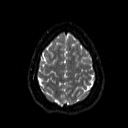
[im 91/100]
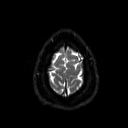
[im 100/100]
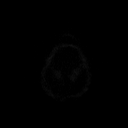

[Series 5: DWI · coronal · 5.0mm · 1.80mm/px · 9 of 74 slices shown (2 of 2)]
[im 1/74]
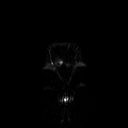
[im 10/74]
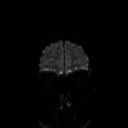
[im 19/74]
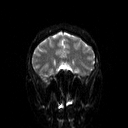
[im 28/74]
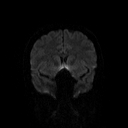
[im 37/74]
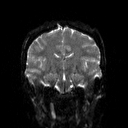
[im 46/74]
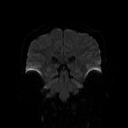
[im 55/74]
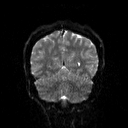
[im 64/74]
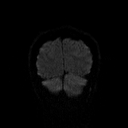
[im 74/74]
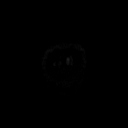

[Series 10: swi_images · axial · 4.0mm · 0.90mm/px · z∈[-63,+77]mm · 5 of 36 slices shown]
[im 1/36]
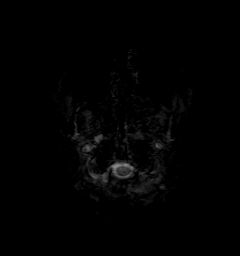
[im 9/36]
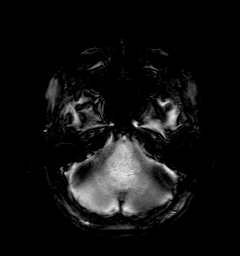
[im 18/36]
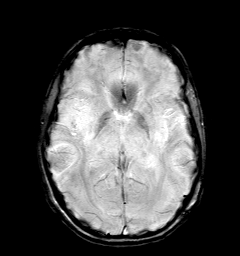
[im 27/36]
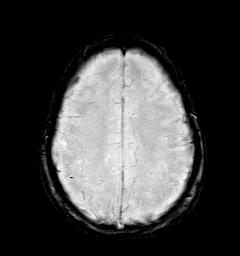
[im 36/36]
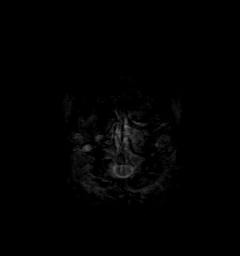

[Series 11: t1_mpr_tra · axial · 1.0mm · 0.71mm/px · z∈[-64,+79]mm · 18 of 144 slices shown]
[im 1/144]
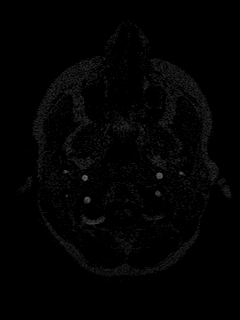
[im 9/144]
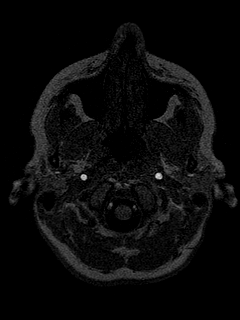
[im 17/144]
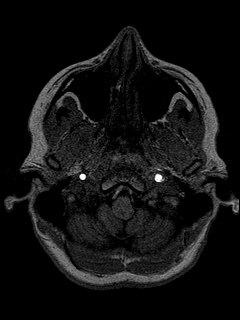
[im 26/144]
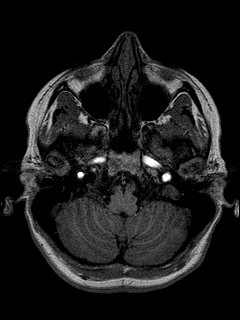
[im 34/144]
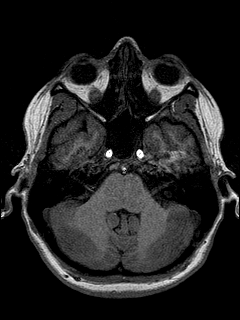
[im 43/144]
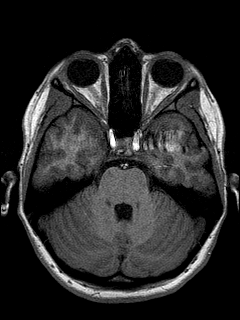
[im 51/144]
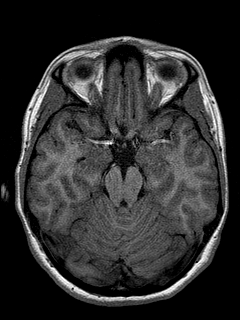
[im 59/144]
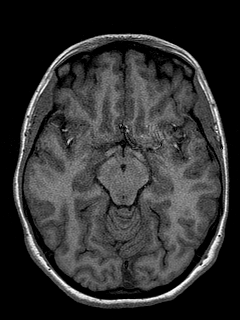
[im 68/144]
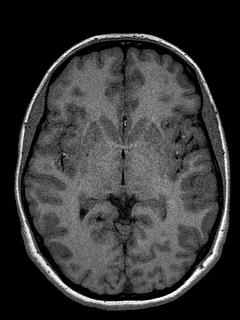
[im 76/144]
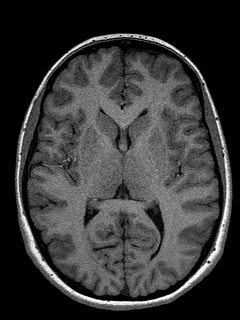
[im 85/144]
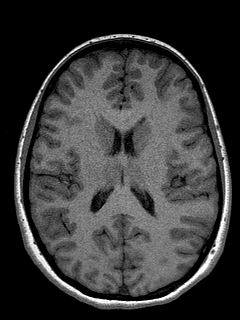
[im 93/144]
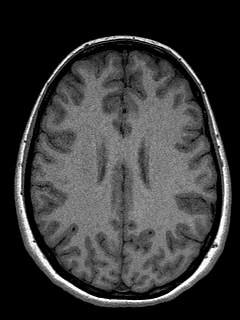
[im 101/144]
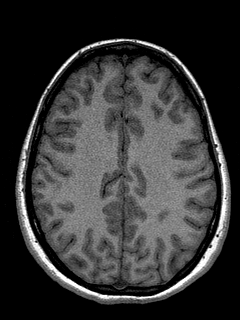
[im 110/144]
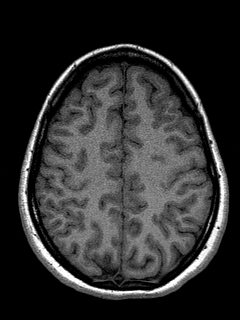
[im 118/144]
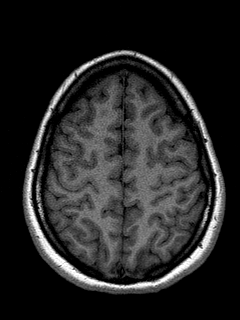
[im 127/144]
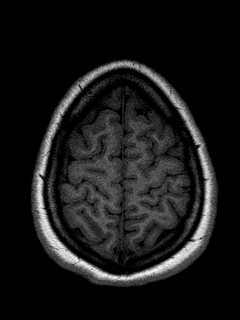
[im 135/144]
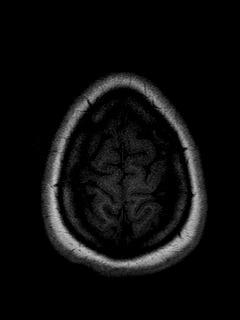
[im 144/144]
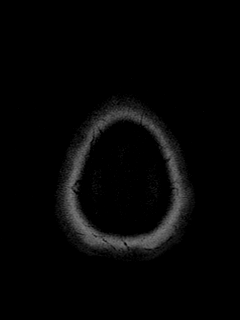

[48 of 48 positions shown; findings below may reference images not displayed]

FINDINGS: Brain: No focal parenchymal signal abnormality. No acute infarct or
intracranial hemorrhage. No midline shift, ventriculomegaly or
extra-axial fluid collection. No mass lesion.

Vascular: Normal flow voids.

Skull and upper cervical spine: Normal marrow signal.

Sinuses/Orbits: Normal orbits. Clear paranasal sinuses. No mastoid
effusion.

Other: None.
IMPRESSION: Normal MRI brain.

## 2021-04-09 ENCOUNTER — Ambulatory Visit: Payer: BC Managed Care – PPO | Admitting: Internal Medicine

## 2021-06-07 DIAGNOSIS — G901 Familial dysautonomia [Riley-Day]: Secondary | ICD-10-CM | POA: Diagnosis not present

## 2021-06-07 DIAGNOSIS — Z6820 Body mass index (BMI) 20.0-20.9, adult: Secondary | ICD-10-CM | POA: Diagnosis not present

## 2021-06-07 DIAGNOSIS — R000 Tachycardia, unspecified: Secondary | ICD-10-CM | POA: Diagnosis not present

## 2021-06-07 DIAGNOSIS — R55 Syncope and collapse: Secondary | ICD-10-CM | POA: Diagnosis not present

## 2021-06-07 DIAGNOSIS — R5383 Other fatigue: Secondary | ICD-10-CM | POA: Diagnosis not present

## 2021-06-07 DIAGNOSIS — G90A Postural orthostatic tachycardia syndrome (POTS): Secondary | ICD-10-CM | POA: Diagnosis not present

## 2021-07-16 DIAGNOSIS — G901 Familial dysautonomia [Riley-Day]: Secondary | ICD-10-CM | POA: Diagnosis not present

## 2021-09-06 DIAGNOSIS — M25561 Pain in right knee: Secondary | ICD-10-CM | POA: Diagnosis not present

## 2021-09-15 DIAGNOSIS — M25561 Pain in right knee: Secondary | ICD-10-CM | POA: Diagnosis not present

## 2021-10-08 DIAGNOSIS — F419 Anxiety disorder, unspecified: Secondary | ICD-10-CM | POA: Diagnosis not present

## 2021-10-08 DIAGNOSIS — Z0000 Encounter for general adult medical examination without abnormal findings: Secondary | ICD-10-CM | POA: Diagnosis not present

## 2021-10-08 DIAGNOSIS — R399 Unspecified symptoms and signs involving the genitourinary system: Secondary | ICD-10-CM | POA: Diagnosis not present

## 2021-11-17 DIAGNOSIS — M545 Low back pain, unspecified: Secondary | ICD-10-CM | POA: Diagnosis not present

## 2021-11-22 DIAGNOSIS — M545 Low back pain, unspecified: Secondary | ICD-10-CM | POA: Diagnosis not present

## 2021-11-29 DIAGNOSIS — M545 Low back pain, unspecified: Secondary | ICD-10-CM | POA: Diagnosis not present

## 2021-12-06 DIAGNOSIS — M545 Low back pain, unspecified: Secondary | ICD-10-CM | POA: Diagnosis not present

## 2021-12-07 DIAGNOSIS — G901 Familial dysautonomia [Riley-Day]: Secondary | ICD-10-CM | POA: Diagnosis not present

## 2021-12-07 DIAGNOSIS — Z6820 Body mass index (BMI) 20.0-20.9, adult: Secondary | ICD-10-CM | POA: Diagnosis not present

## 2021-12-28 DIAGNOSIS — N83209 Unspecified ovarian cyst, unspecified side: Secondary | ICD-10-CM | POA: Diagnosis not present

## 2021-12-28 DIAGNOSIS — Z30430 Encounter for insertion of intrauterine contraceptive device: Secondary | ICD-10-CM | POA: Diagnosis not present

## 2022-02-18 IMAGING — US US PELVIS COMPLETE TRANSABD/TRANSVAG W DUPLEX
1 series · 13 of 25 positions shown · non-contrast
Comparison: None.

CLINICAL DATA: Pelvic pain.  History of left ovarian cyst.

EXAM:
TRANSABDOMINAL AND TRANSVAGINAL ULTRASOUND OF PELVIS
DOPPLER ULTRASOUND OF OVARIES
TECHNIQUE: Both transabdominal and transvaginal ultrasound examinations of the
pelvis were performed. Transabdominal technique was performed for
global imaging of the pelvis including uterus, ovaries, adnexal
regions, and pelvic cul-de-sac.
It was necessary to proceed with endovaginal exam following the
transabdominal exam to visualize the endometrium, uterus, bilateral
ovaries, bilateral adnexae. Color and duplex Doppler ultrasound was
utilized to evaluate blood flow to the ovaries.

[Series 1: us pelvic complete w transvaginal and torsion righ · 136 acquisitions, 13 frames shown]
[im 1/136]
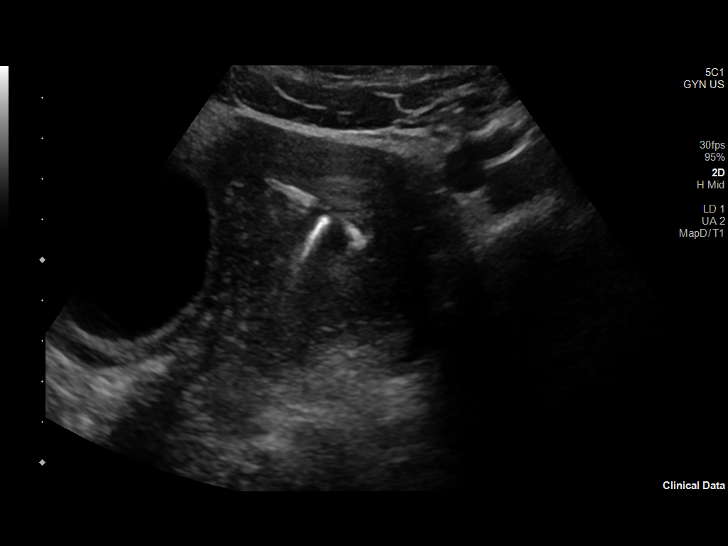
[im 12/136]
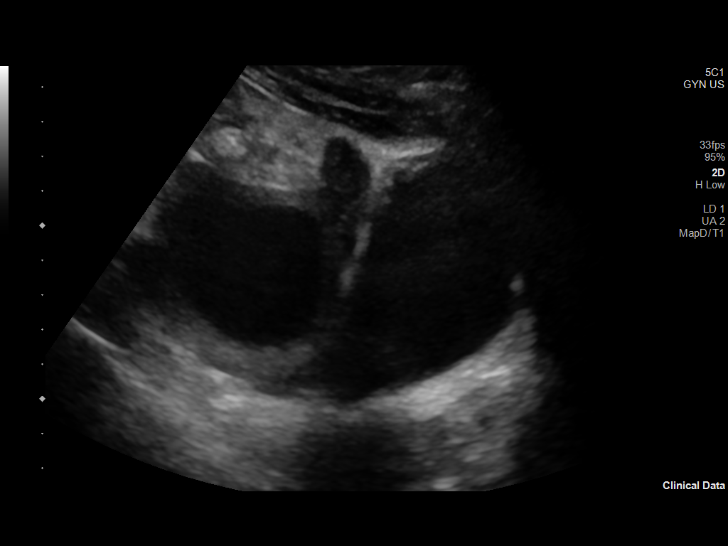
[im 23/136]
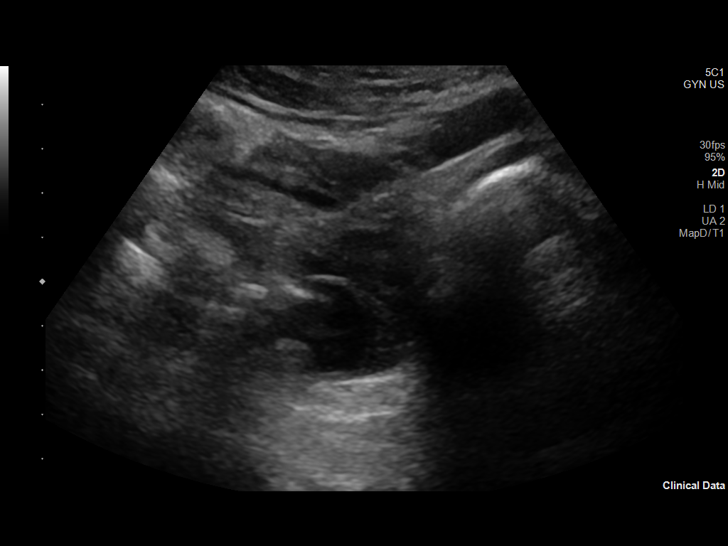
[im 34/136]
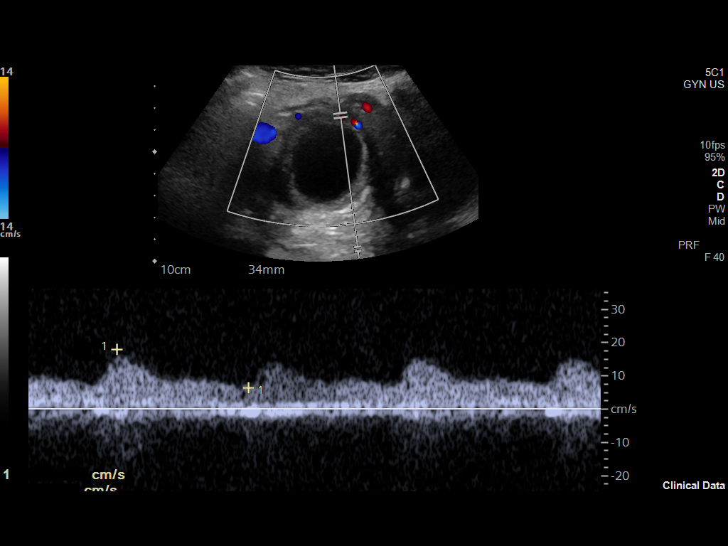
[im 46/136]
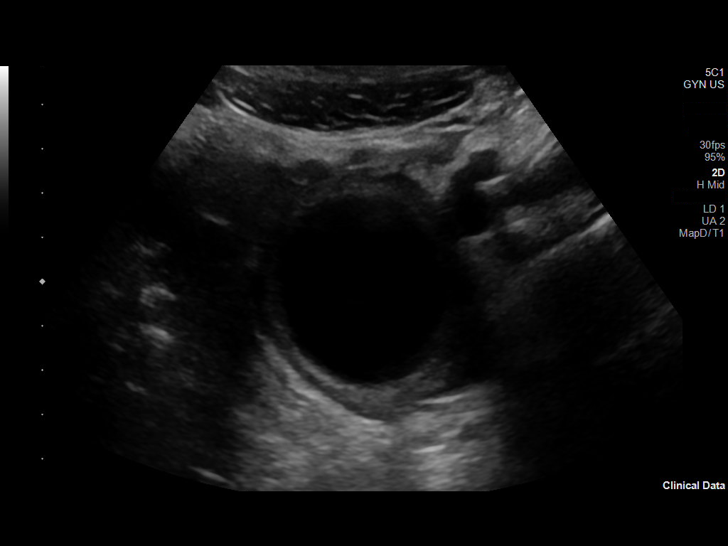
[im 57/136]
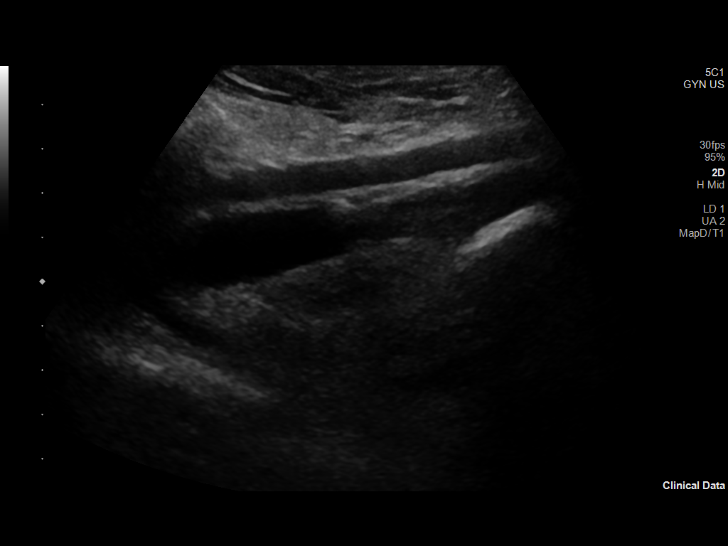
[im 68/136]
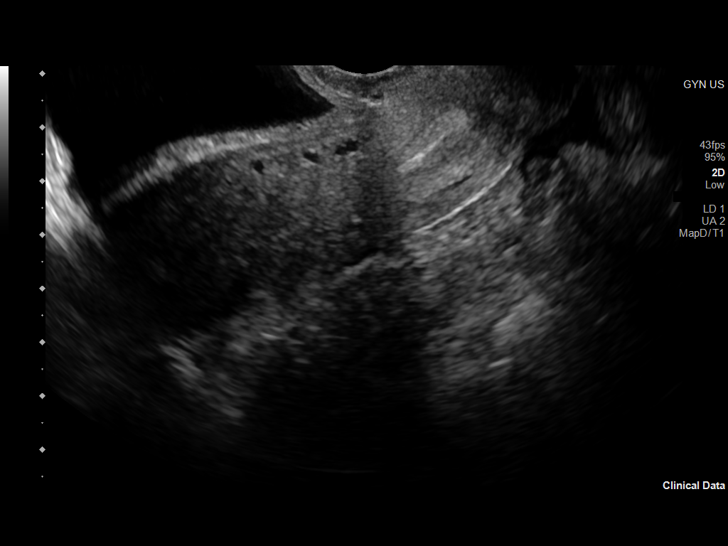
[im 79/136]
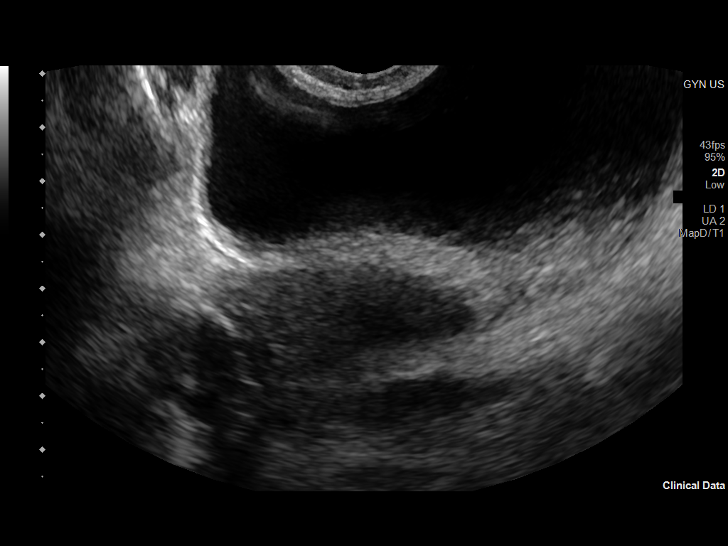
[im 91/136]
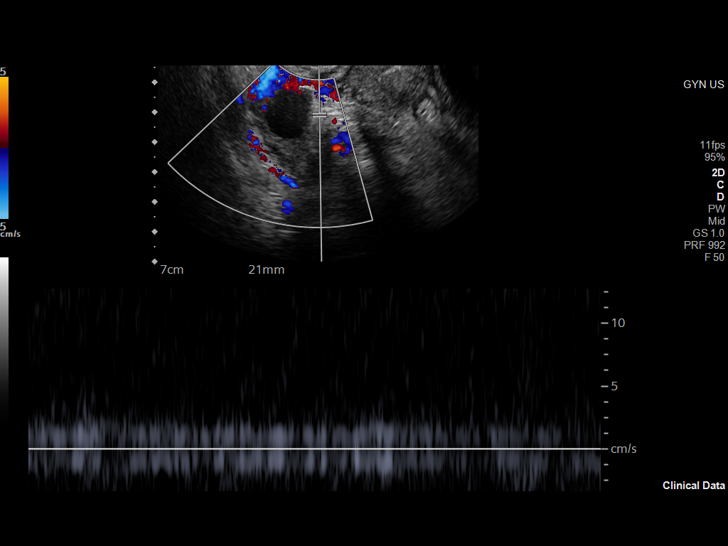
[im 102/136]
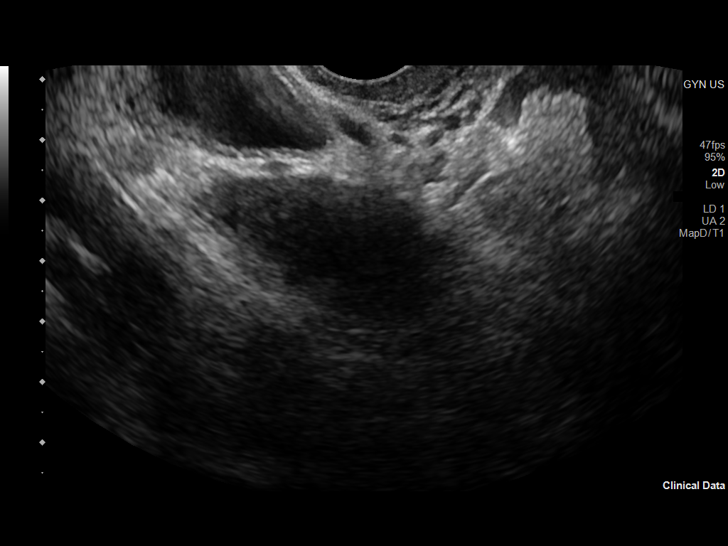
[im 113/136]
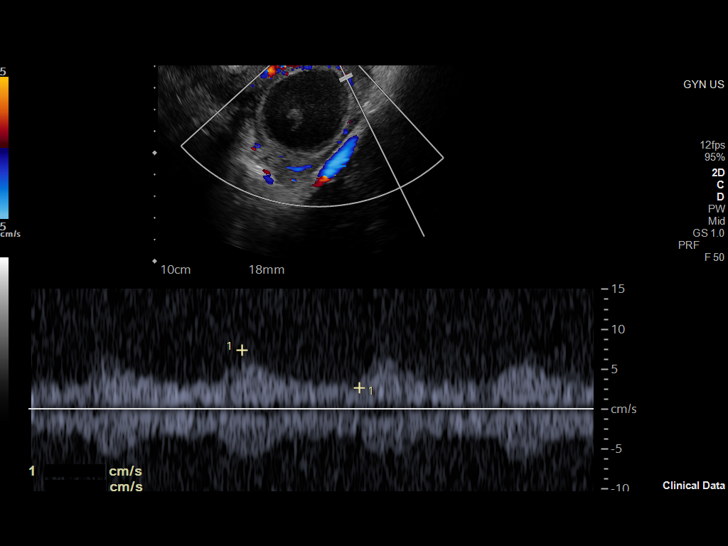
[im 124/136]
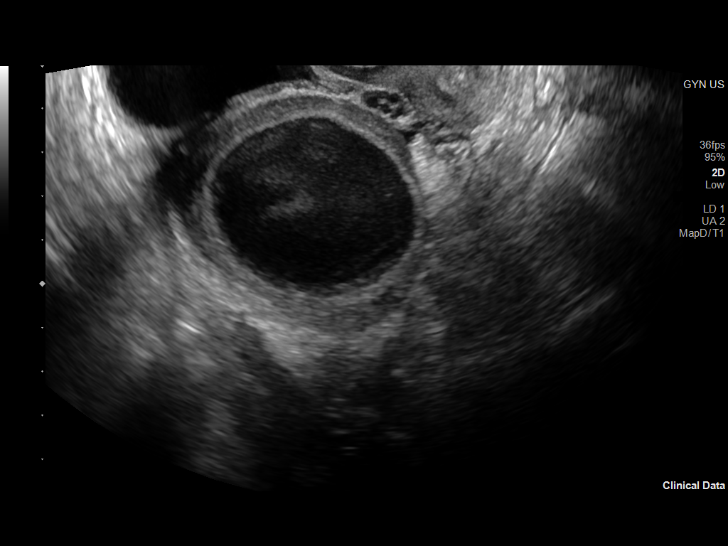
[im 136/136]
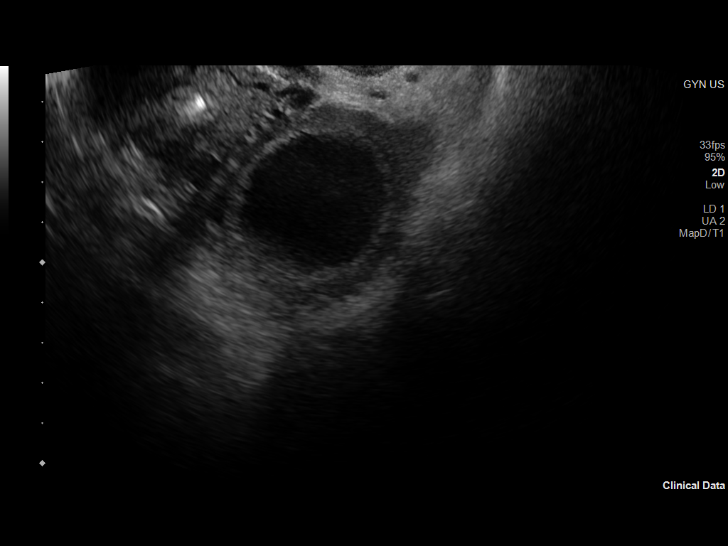

[13 of 25 positions shown; findings below may reference images not displayed]

FINDINGS: Uterus

Measurements: 10.7 x 3.7 x 5.1 cm = volume: 105 mL. Trace volume
free fluid within the endometrial canal. A T-shaped intrauterine
device is noted appropriately position within the endometrial canal.
No fibroids or other mass visualized.

Endometrium

Thickness: 7 mm.  No focal abnormality visualized.

Right ovary

Measurements: 4.3 x 2.8 x 3.6 cm = volume: 23 mL. There is a cystic
lesion measuring 2 cm. Normal appearance/no adnexal mass.

Left ovary

Measurements: 6.4 x 4.6 x 5.8 cm = volume: 90 mL. There is a 3.7 x
4.4 x 4.8 cm left ovarian cystic lesion with associated multiple
irregular thick septations (ultrasound 1-11). No definite mural
nodularity or vascularity. Otherwise normal appearance of the left
ovary.

Pulsed Doppler evaluation of both ovaries demonstrates normal
low-resistance arterial and venous waveforms.

Other findings

No abnormal free fluid.
IMPRESSION: 1. Complex 4.8 cm left ovarian cystic lesion with associated
multiple irregular thick septations. Recommend gynecologic
consultation.
2. No findings to suggest ovarian torsion/ischemia; however, this
does not exclude ovarian torsion as this can be an intermittent
finding.
3. Well-positioned T-shaped intrauterine device.

## 2022-03-01 DIAGNOSIS — T8332XA Displacement of intrauterine contraceptive device, initial encounter: Secondary | ICD-10-CM | POA: Diagnosis not present

## 2022-03-01 DIAGNOSIS — Z30431 Encounter for routine checking of intrauterine contraceptive device: Secondary | ICD-10-CM | POA: Diagnosis not present

## 2022-04-19 ENCOUNTER — Telehealth: Payer: Self-pay | Admitting: Internal Medicine

## 2022-04-19 NOTE — Telephone Encounter (Signed)
Patient states Dr. Caryl Comes had recommended a handicap placard for her, but she says there is a problem with her school. She says they are requesting to see a picture of registration to verify that it is registered to her and she is not using someone else's.

## 2022-04-21 NOTE — Telephone Encounter (Signed)
Attempted phone call to pt and left voicemail message to contact RN at [phone]. 

## 2022-05-05 NOTE — Telephone Encounter (Signed)
Spoke with pt who states issue with handicap placard has been resolved.  Pt thanked Therapist, sports for the callback.

## 2022-07-19 DIAGNOSIS — J018 Other acute sinusitis: Secondary | ICD-10-CM | POA: Diagnosis not present

## 2022-07-19 DIAGNOSIS — Z20822 Contact with and (suspected) exposure to covid-19: Secondary | ICD-10-CM | POA: Diagnosis not present

## 2022-07-19 DIAGNOSIS — J029 Acute pharyngitis, unspecified: Secondary | ICD-10-CM | POA: Diagnosis not present

## 2022-07-19 DIAGNOSIS — Z03818 Encounter for observation for suspected exposure to other biological agents ruled out: Secondary | ICD-10-CM | POA: Diagnosis not present

## 2022-07-19 DIAGNOSIS — Z6820 Body mass index (BMI) 20.0-20.9, adult: Secondary | ICD-10-CM | POA: Diagnosis not present

## 2022-08-05 DIAGNOSIS — R079 Chest pain, unspecified: Secondary | ICD-10-CM | POA: Diagnosis not present

## 2022-08-05 DIAGNOSIS — R0789 Other chest pain: Secondary | ICD-10-CM | POA: Diagnosis not present

## 2022-08-06 DIAGNOSIS — R0789 Other chest pain: Secondary | ICD-10-CM | POA: Diagnosis not present

## 2022-08-10 DIAGNOSIS — M62830 Muscle spasm of back: Secondary | ICD-10-CM | POA: Diagnosis not present

## 2022-08-10 DIAGNOSIS — M9901 Segmental and somatic dysfunction of cervical region: Secondary | ICD-10-CM | POA: Diagnosis not present

## 2022-08-10 DIAGNOSIS — M9902 Segmental and somatic dysfunction of thoracic region: Secondary | ICD-10-CM | POA: Diagnosis not present

## 2022-08-10 DIAGNOSIS — M546 Pain in thoracic spine: Secondary | ICD-10-CM | POA: Diagnosis not present

## 2022-08-12 DIAGNOSIS — M9901 Segmental and somatic dysfunction of cervical region: Secondary | ICD-10-CM | POA: Diagnosis not present

## 2022-08-12 DIAGNOSIS — M62830 Muscle spasm of back: Secondary | ICD-10-CM | POA: Diagnosis not present

## 2022-08-12 DIAGNOSIS — M546 Pain in thoracic spine: Secondary | ICD-10-CM | POA: Diagnosis not present

## 2022-08-12 DIAGNOSIS — M9902 Segmental and somatic dysfunction of thoracic region: Secondary | ICD-10-CM | POA: Diagnosis not present

## 2022-08-15 DIAGNOSIS — M9902 Segmental and somatic dysfunction of thoracic region: Secondary | ICD-10-CM | POA: Diagnosis not present

## 2022-08-15 DIAGNOSIS — M546 Pain in thoracic spine: Secondary | ICD-10-CM | POA: Diagnosis not present

## 2022-08-15 DIAGNOSIS — M62830 Muscle spasm of back: Secondary | ICD-10-CM | POA: Diagnosis not present

## 2022-08-15 DIAGNOSIS — M9901 Segmental and somatic dysfunction of cervical region: Secondary | ICD-10-CM | POA: Diagnosis not present

## 2022-08-19 DIAGNOSIS — M62830 Muscle spasm of back: Secondary | ICD-10-CM | POA: Diagnosis not present

## 2022-08-19 DIAGNOSIS — M546 Pain in thoracic spine: Secondary | ICD-10-CM | POA: Diagnosis not present

## 2022-08-19 DIAGNOSIS — M9902 Segmental and somatic dysfunction of thoracic region: Secondary | ICD-10-CM | POA: Diagnosis not present

## 2022-08-19 DIAGNOSIS — M9901 Segmental and somatic dysfunction of cervical region: Secondary | ICD-10-CM | POA: Diagnosis not present

## 2022-08-24 DIAGNOSIS — M9902 Segmental and somatic dysfunction of thoracic region: Secondary | ICD-10-CM | POA: Diagnosis not present

## 2022-08-24 DIAGNOSIS — M5441 Lumbago with sciatica, right side: Secondary | ICD-10-CM | POA: Diagnosis not present

## 2022-08-24 DIAGNOSIS — M62830 Muscle spasm of back: Secondary | ICD-10-CM | POA: Diagnosis not present

## 2022-08-31 DIAGNOSIS — M62830 Muscle spasm of back: Secondary | ICD-10-CM | POA: Diagnosis not present

## 2022-08-31 DIAGNOSIS — M5441 Lumbago with sciatica, right side: Secondary | ICD-10-CM | POA: Diagnosis not present

## 2022-08-31 DIAGNOSIS — M9902 Segmental and somatic dysfunction of thoracic region: Secondary | ICD-10-CM | POA: Diagnosis not present

## 2022-09-07 DIAGNOSIS — M9902 Segmental and somatic dysfunction of thoracic region: Secondary | ICD-10-CM | POA: Diagnosis not present

## 2022-09-07 DIAGNOSIS — M5441 Lumbago with sciatica, right side: Secondary | ICD-10-CM | POA: Diagnosis not present

## 2022-09-07 DIAGNOSIS — M62830 Muscle spasm of back: Secondary | ICD-10-CM | POA: Diagnosis not present

## 2022-09-14 DIAGNOSIS — M9902 Segmental and somatic dysfunction of thoracic region: Secondary | ICD-10-CM | POA: Diagnosis not present

## 2022-09-14 DIAGNOSIS — M62830 Muscle spasm of back: Secondary | ICD-10-CM | POA: Diagnosis not present

## 2022-09-14 DIAGNOSIS — M5441 Lumbago with sciatica, right side: Secondary | ICD-10-CM | POA: Diagnosis not present

## 2022-09-21 DIAGNOSIS — M5441 Lumbago with sciatica, right side: Secondary | ICD-10-CM | POA: Diagnosis not present

## 2022-09-21 DIAGNOSIS — M62830 Muscle spasm of back: Secondary | ICD-10-CM | POA: Diagnosis not present

## 2022-09-21 DIAGNOSIS — M9902 Segmental and somatic dysfunction of thoracic region: Secondary | ICD-10-CM | POA: Diagnosis not present

## 2022-09-28 DIAGNOSIS — M62830 Muscle spasm of back: Secondary | ICD-10-CM | POA: Diagnosis not present

## 2022-09-28 DIAGNOSIS — M5441 Lumbago with sciatica, right side: Secondary | ICD-10-CM | POA: Diagnosis not present

## 2022-09-28 DIAGNOSIS — M9902 Segmental and somatic dysfunction of thoracic region: Secondary | ICD-10-CM | POA: Diagnosis not present

## 2022-10-05 DIAGNOSIS — M9902 Segmental and somatic dysfunction of thoracic region: Secondary | ICD-10-CM | POA: Diagnosis not present

## 2022-10-05 DIAGNOSIS — M62830 Muscle spasm of back: Secondary | ICD-10-CM | POA: Diagnosis not present

## 2022-10-05 DIAGNOSIS — M5441 Lumbago with sciatica, right side: Secondary | ICD-10-CM | POA: Diagnosis not present

## 2022-10-19 DIAGNOSIS — Z0000 Encounter for general adult medical examination without abnormal findings: Secondary | ICD-10-CM | POA: Diagnosis not present

## 2022-10-19 DIAGNOSIS — D649 Anemia, unspecified: Secondary | ICD-10-CM | POA: Diagnosis not present

## 2022-10-19 DIAGNOSIS — Z13220 Encounter for screening for lipoid disorders: Secondary | ICD-10-CM | POA: Diagnosis not present

## 2022-10-19 DIAGNOSIS — L700 Acne vulgaris: Secondary | ICD-10-CM | POA: Diagnosis not present

## 2022-10-20 DIAGNOSIS — L700 Acne vulgaris: Secondary | ICD-10-CM | POA: Diagnosis not present

## 2022-12-08 DIAGNOSIS — Z3009 Encounter for other general counseling and advice on contraception: Secondary | ICD-10-CM | POA: Diagnosis not present

## 2022-12-08 DIAGNOSIS — R102 Pelvic and perineal pain: Secondary | ICD-10-CM | POA: Diagnosis not present

## 2022-12-08 DIAGNOSIS — Z30432 Encounter for removal of intrauterine contraceptive device: Secondary | ICD-10-CM | POA: Diagnosis not present

## 2023-01-03 DIAGNOSIS — M9903 Segmental and somatic dysfunction of lumbar region: Secondary | ICD-10-CM | POA: Diagnosis not present

## 2023-01-03 DIAGNOSIS — M7918 Myalgia, other site: Secondary | ICD-10-CM | POA: Diagnosis not present

## 2023-01-03 DIAGNOSIS — M7632 Iliotibial band syndrome, left leg: Secondary | ICD-10-CM | POA: Diagnosis not present

## 2023-01-03 DIAGNOSIS — M9904 Segmental and somatic dysfunction of sacral region: Secondary | ICD-10-CM | POA: Diagnosis not present

## 2023-01-03 DIAGNOSIS — M25562 Pain in left knee: Secondary | ICD-10-CM | POA: Diagnosis not present

## 2023-01-03 DIAGNOSIS — M9905 Segmental and somatic dysfunction of pelvic region: Secondary | ICD-10-CM | POA: Diagnosis not present

## 2023-01-09 DIAGNOSIS — M9903 Segmental and somatic dysfunction of lumbar region: Secondary | ICD-10-CM | POA: Diagnosis not present

## 2023-01-09 DIAGNOSIS — M9905 Segmental and somatic dysfunction of pelvic region: Secondary | ICD-10-CM | POA: Diagnosis not present

## 2023-01-09 DIAGNOSIS — M7632 Iliotibial band syndrome, left leg: Secondary | ICD-10-CM | POA: Diagnosis not present

## 2023-01-09 DIAGNOSIS — M7918 Myalgia, other site: Secondary | ICD-10-CM | POA: Diagnosis not present

## 2023-01-09 DIAGNOSIS — M79652 Pain in left thigh: Secondary | ICD-10-CM | POA: Diagnosis not present

## 2023-01-09 DIAGNOSIS — M9904 Segmental and somatic dysfunction of sacral region: Secondary | ICD-10-CM | POA: Diagnosis not present

## 2023-01-16 DIAGNOSIS — M7918 Myalgia, other site: Secondary | ICD-10-CM | POA: Diagnosis not present

## 2023-01-16 DIAGNOSIS — M9905 Segmental and somatic dysfunction of pelvic region: Secondary | ICD-10-CM | POA: Diagnosis not present

## 2023-01-16 DIAGNOSIS — M25562 Pain in left knee: Secondary | ICD-10-CM | POA: Diagnosis not present

## 2023-01-16 DIAGNOSIS — M7632 Iliotibial band syndrome, left leg: Secondary | ICD-10-CM | POA: Diagnosis not present

## 2023-01-16 DIAGNOSIS — M9903 Segmental and somatic dysfunction of lumbar region: Secondary | ICD-10-CM | POA: Diagnosis not present

## 2023-01-16 DIAGNOSIS — M9904 Segmental and somatic dysfunction of sacral region: Secondary | ICD-10-CM | POA: Diagnosis not present

## 2023-02-15 DIAGNOSIS — Z118 Encounter for screening for other infectious and parasitic diseases: Secondary | ICD-10-CM | POA: Diagnosis not present

## 2023-02-15 DIAGNOSIS — Z124 Encounter for screening for malignant neoplasm of cervix: Secondary | ICD-10-CM | POA: Diagnosis not present

## 2023-02-15 DIAGNOSIS — Z1331 Encounter for screening for depression: Secondary | ICD-10-CM | POA: Diagnosis not present

## 2023-02-15 DIAGNOSIS — Z01419 Encounter for gynecological examination (general) (routine) without abnormal findings: Secondary | ICD-10-CM | POA: Diagnosis not present

## 2023-02-24 DIAGNOSIS — Z3046 Encounter for surveillance of implantable subdermal contraceptive: Secondary | ICD-10-CM | POA: Diagnosis not present

## 2023-03-06 DIAGNOSIS — Z0100 Encounter for examination of eyes and vision without abnormal findings: Secondary | ICD-10-CM | POA: Diagnosis not present

## 2023-03-09 DIAGNOSIS — M545 Low back pain, unspecified: Secondary | ICD-10-CM | POA: Diagnosis not present

## 2023-03-10 DIAGNOSIS — L910 Hypertrophic scar: Secondary | ICD-10-CM | POA: Diagnosis not present

## 2023-03-10 DIAGNOSIS — L700 Acne vulgaris: Secondary | ICD-10-CM | POA: Diagnosis not present

## 2023-03-22 DIAGNOSIS — Z3202 Encounter for pregnancy test, result negative: Secondary | ICD-10-CM | POA: Diagnosis not present

## 2023-03-22 DIAGNOSIS — Z3046 Encounter for surveillance of implantable subdermal contraceptive: Secondary | ICD-10-CM | POA: Diagnosis not present

## 2023-03-26 DIAGNOSIS — R051 Acute cough: Secondary | ICD-10-CM | POA: Diagnosis not present

## 2023-03-26 DIAGNOSIS — Z6821 Body mass index (BMI) 21.0-21.9, adult: Secondary | ICD-10-CM | POA: Diagnosis not present

## 2023-03-26 DIAGNOSIS — J0140 Acute pansinusitis, unspecified: Secondary | ICD-10-CM | POA: Diagnosis not present

## 2023-04-27 DIAGNOSIS — M545 Low back pain, unspecified: Secondary | ICD-10-CM | POA: Diagnosis not present

## 2023-04-27 DIAGNOSIS — M5106 Intervertebral disc disorders with myelopathy, lumbar region: Secondary | ICD-10-CM | POA: Diagnosis not present

## 2023-05-04 DIAGNOSIS — M7918 Myalgia, other site: Secondary | ICD-10-CM | POA: Diagnosis not present

## 2023-05-04 DIAGNOSIS — M9904 Segmental and somatic dysfunction of sacral region: Secondary | ICD-10-CM | POA: Diagnosis not present

## 2023-05-04 DIAGNOSIS — M9903 Segmental and somatic dysfunction of lumbar region: Secondary | ICD-10-CM | POA: Diagnosis not present

## 2023-05-04 DIAGNOSIS — M9905 Segmental and somatic dysfunction of pelvic region: Secondary | ICD-10-CM | POA: Diagnosis not present

## 2023-05-04 DIAGNOSIS — M545 Low back pain, unspecified: Secondary | ICD-10-CM | POA: Diagnosis not present

## 2023-05-11 DIAGNOSIS — M545 Low back pain, unspecified: Secondary | ICD-10-CM | POA: Diagnosis not present

## 2023-05-17 DIAGNOSIS — M545 Low back pain, unspecified: Secondary | ICD-10-CM | POA: Diagnosis not present

## 2023-05-24 DIAGNOSIS — M545 Low back pain, unspecified: Secondary | ICD-10-CM | POA: Diagnosis not present

## 2023-05-29 DIAGNOSIS — M545 Low back pain, unspecified: Secondary | ICD-10-CM | POA: Diagnosis not present

## 2023-06-05 DIAGNOSIS — M545 Low back pain, unspecified: Secondary | ICD-10-CM | POA: Diagnosis not present

## 2023-06-12 DIAGNOSIS — M545 Low back pain, unspecified: Secondary | ICD-10-CM | POA: Diagnosis not present

## 2023-06-15 DIAGNOSIS — D509 Iron deficiency anemia, unspecified: Secondary | ICD-10-CM | POA: Diagnosis not present

## 2023-06-15 DIAGNOSIS — E559 Vitamin D deficiency, unspecified: Secondary | ICD-10-CM | POA: Diagnosis not present

## 2023-06-15 DIAGNOSIS — M545 Low back pain, unspecified: Secondary | ICD-10-CM | POA: Diagnosis not present

## 2023-06-15 DIAGNOSIS — M25552 Pain in left hip: Secondary | ICD-10-CM | POA: Diagnosis not present

## 2023-06-15 DIAGNOSIS — R5383 Other fatigue: Secondary | ICD-10-CM | POA: Diagnosis not present

## 2023-06-15 DIAGNOSIS — M25551 Pain in right hip: Secondary | ICD-10-CM | POA: Diagnosis not present

## 2023-06-21 DIAGNOSIS — M545 Low back pain, unspecified: Secondary | ICD-10-CM | POA: Diagnosis not present

## 2023-06-26 DIAGNOSIS — M545 Low back pain, unspecified: Secondary | ICD-10-CM | POA: Diagnosis not present

## 2023-06-27 DIAGNOSIS — S39012A Strain of muscle, fascia and tendon of lower back, initial encounter: Secondary | ICD-10-CM | POA: Diagnosis not present

## 2023-06-27 DIAGNOSIS — M62838 Other muscle spasm: Secondary | ICD-10-CM | POA: Diagnosis not present

## 2023-06-28 DIAGNOSIS — M62838 Other muscle spasm: Secondary | ICD-10-CM | POA: Diagnosis not present

## 2023-06-28 DIAGNOSIS — S39012A Strain of muscle, fascia and tendon of lower back, initial encounter: Secondary | ICD-10-CM | POA: Diagnosis not present

## 2023-06-29 DIAGNOSIS — S39012A Strain of muscle, fascia and tendon of lower back, initial encounter: Secondary | ICD-10-CM | POA: Diagnosis not present

## 2023-07-18 DIAGNOSIS — M5451 Vertebrogenic low back pain: Secondary | ICD-10-CM | POA: Diagnosis not present

## 2023-07-18 DIAGNOSIS — M9903 Segmental and somatic dysfunction of lumbar region: Secondary | ICD-10-CM | POA: Diagnosis not present

## 2023-07-18 DIAGNOSIS — M9905 Segmental and somatic dysfunction of pelvic region: Secondary | ICD-10-CM | POA: Diagnosis not present

## 2023-07-18 DIAGNOSIS — M9902 Segmental and somatic dysfunction of thoracic region: Secondary | ICD-10-CM | POA: Diagnosis not present

## 2023-07-18 DIAGNOSIS — M5431 Sciatica, right side: Secondary | ICD-10-CM | POA: Diagnosis not present

## 2023-07-20 DIAGNOSIS — M5451 Vertebrogenic low back pain: Secondary | ICD-10-CM | POA: Diagnosis not present

## 2023-07-20 DIAGNOSIS — M9902 Segmental and somatic dysfunction of thoracic region: Secondary | ICD-10-CM | POA: Diagnosis not present

## 2023-07-20 DIAGNOSIS — M9905 Segmental and somatic dysfunction of pelvic region: Secondary | ICD-10-CM | POA: Diagnosis not present

## 2023-07-20 DIAGNOSIS — M5431 Sciatica, right side: Secondary | ICD-10-CM | POA: Diagnosis not present

## 2023-07-20 DIAGNOSIS — M9903 Segmental and somatic dysfunction of lumbar region: Secondary | ICD-10-CM | POA: Diagnosis not present

## 2023-07-25 DIAGNOSIS — M9902 Segmental and somatic dysfunction of thoracic region: Secondary | ICD-10-CM | POA: Diagnosis not present

## 2023-07-25 DIAGNOSIS — M9903 Segmental and somatic dysfunction of lumbar region: Secondary | ICD-10-CM | POA: Diagnosis not present

## 2023-07-25 DIAGNOSIS — M5451 Vertebrogenic low back pain: Secondary | ICD-10-CM | POA: Diagnosis not present

## 2023-07-25 DIAGNOSIS — M5431 Sciatica, right side: Secondary | ICD-10-CM | POA: Diagnosis not present

## 2023-07-25 DIAGNOSIS — M9905 Segmental and somatic dysfunction of pelvic region: Secondary | ICD-10-CM | POA: Diagnosis not present

## 2023-07-27 DIAGNOSIS — S39012A Strain of muscle, fascia and tendon of lower back, initial encounter: Secondary | ICD-10-CM | POA: Diagnosis not present

## 2023-07-27 DIAGNOSIS — M5451 Vertebrogenic low back pain: Secondary | ICD-10-CM | POA: Diagnosis not present

## 2023-07-27 DIAGNOSIS — M9902 Segmental and somatic dysfunction of thoracic region: Secondary | ICD-10-CM | POA: Diagnosis not present

## 2023-07-27 DIAGNOSIS — M5431 Sciatica, right side: Secondary | ICD-10-CM | POA: Diagnosis not present

## 2023-07-27 DIAGNOSIS — M62838 Other muscle spasm: Secondary | ICD-10-CM | POA: Diagnosis not present

## 2023-07-27 DIAGNOSIS — M9903 Segmental and somatic dysfunction of lumbar region: Secondary | ICD-10-CM | POA: Diagnosis not present

## 2023-07-27 DIAGNOSIS — M9905 Segmental and somatic dysfunction of pelvic region: Secondary | ICD-10-CM | POA: Diagnosis not present

## 2023-08-02 DIAGNOSIS — M9902 Segmental and somatic dysfunction of thoracic region: Secondary | ICD-10-CM | POA: Diagnosis not present

## 2023-08-02 DIAGNOSIS — M5451 Vertebrogenic low back pain: Secondary | ICD-10-CM | POA: Diagnosis not present

## 2023-08-02 DIAGNOSIS — M5431 Sciatica, right side: Secondary | ICD-10-CM | POA: Diagnosis not present

## 2023-08-02 DIAGNOSIS — M9903 Segmental and somatic dysfunction of lumbar region: Secondary | ICD-10-CM | POA: Diagnosis not present

## 2023-08-02 DIAGNOSIS — M9905 Segmental and somatic dysfunction of pelvic region: Secondary | ICD-10-CM | POA: Diagnosis not present

## 2023-08-04 DIAGNOSIS — M9905 Segmental and somatic dysfunction of pelvic region: Secondary | ICD-10-CM | POA: Diagnosis not present

## 2023-08-04 DIAGNOSIS — M9903 Segmental and somatic dysfunction of lumbar region: Secondary | ICD-10-CM | POA: Diagnosis not present

## 2023-08-04 DIAGNOSIS — M5451 Vertebrogenic low back pain: Secondary | ICD-10-CM | POA: Diagnosis not present

## 2023-08-04 DIAGNOSIS — M9902 Segmental and somatic dysfunction of thoracic region: Secondary | ICD-10-CM | POA: Diagnosis not present

## 2023-08-04 DIAGNOSIS — M5431 Sciatica, right side: Secondary | ICD-10-CM | POA: Diagnosis not present

## 2023-08-08 DIAGNOSIS — M9905 Segmental and somatic dysfunction of pelvic region: Secondary | ICD-10-CM | POA: Diagnosis not present

## 2023-08-08 DIAGNOSIS — M5431 Sciatica, right side: Secondary | ICD-10-CM | POA: Diagnosis not present

## 2023-08-08 DIAGNOSIS — M9902 Segmental and somatic dysfunction of thoracic region: Secondary | ICD-10-CM | POA: Diagnosis not present

## 2023-08-08 DIAGNOSIS — M9903 Segmental and somatic dysfunction of lumbar region: Secondary | ICD-10-CM | POA: Diagnosis not present

## 2023-08-10 DIAGNOSIS — M5451 Vertebrogenic low back pain: Secondary | ICD-10-CM | POA: Diagnosis not present

## 2023-08-10 DIAGNOSIS — M5431 Sciatica, right side: Secondary | ICD-10-CM | POA: Diagnosis not present

## 2023-08-10 DIAGNOSIS — M9903 Segmental and somatic dysfunction of lumbar region: Secondary | ICD-10-CM | POA: Diagnosis not present

## 2023-08-10 DIAGNOSIS — M9905 Segmental and somatic dysfunction of pelvic region: Secondary | ICD-10-CM | POA: Diagnosis not present

## 2023-08-10 DIAGNOSIS — M9902 Segmental and somatic dysfunction of thoracic region: Secondary | ICD-10-CM | POA: Diagnosis not present

## 2023-08-15 DIAGNOSIS — M5126 Other intervertebral disc displacement, lumbar region: Secondary | ICD-10-CM | POA: Diagnosis not present

## 2023-08-15 DIAGNOSIS — M5431 Sciatica, right side: Secondary | ICD-10-CM | POA: Diagnosis not present

## 2023-08-15 DIAGNOSIS — M9903 Segmental and somatic dysfunction of lumbar region: Secondary | ICD-10-CM | POA: Diagnosis not present

## 2023-08-15 DIAGNOSIS — M9905 Segmental and somatic dysfunction of pelvic region: Secondary | ICD-10-CM | POA: Diagnosis not present

## 2023-08-15 DIAGNOSIS — M9902 Segmental and somatic dysfunction of thoracic region: Secondary | ICD-10-CM | POA: Diagnosis not present

## 2023-08-15 DIAGNOSIS — M5451 Vertebrogenic low back pain: Secondary | ICD-10-CM | POA: Diagnosis not present

## 2023-08-17 DIAGNOSIS — M5431 Sciatica, right side: Secondary | ICD-10-CM | POA: Diagnosis not present

## 2023-08-17 DIAGNOSIS — M5451 Vertebrogenic low back pain: Secondary | ICD-10-CM | POA: Diagnosis not present

## 2023-08-17 DIAGNOSIS — M9902 Segmental and somatic dysfunction of thoracic region: Secondary | ICD-10-CM | POA: Diagnosis not present

## 2023-08-17 DIAGNOSIS — M9905 Segmental and somatic dysfunction of pelvic region: Secondary | ICD-10-CM | POA: Diagnosis not present

## 2023-08-17 DIAGNOSIS — M9903 Segmental and somatic dysfunction of lumbar region: Secondary | ICD-10-CM | POA: Diagnosis not present

## 2023-08-24 DIAGNOSIS — M5126 Other intervertebral disc displacement, lumbar region: Secondary | ICD-10-CM | POA: Diagnosis not present

## 2023-08-27 DIAGNOSIS — M62838 Other muscle spasm: Secondary | ICD-10-CM | POA: Diagnosis not present

## 2023-08-27 DIAGNOSIS — S39012A Strain of muscle, fascia and tendon of lower back, initial encounter: Secondary | ICD-10-CM | POA: Diagnosis not present

## 2023-08-31 DIAGNOSIS — M9903 Segmental and somatic dysfunction of lumbar region: Secondary | ICD-10-CM | POA: Diagnosis not present

## 2023-08-31 DIAGNOSIS — M5451 Vertebrogenic low back pain: Secondary | ICD-10-CM | POA: Diagnosis not present

## 2023-08-31 DIAGNOSIS — M5431 Sciatica, right side: Secondary | ICD-10-CM | POA: Diagnosis not present

## 2023-08-31 DIAGNOSIS — M9902 Segmental and somatic dysfunction of thoracic region: Secondary | ICD-10-CM | POA: Diagnosis not present

## 2023-08-31 DIAGNOSIS — M9905 Segmental and somatic dysfunction of pelvic region: Secondary | ICD-10-CM | POA: Diagnosis not present

## 2023-09-21 DIAGNOSIS — J029 Acute pharyngitis, unspecified: Secondary | ICD-10-CM | POA: Diagnosis not present

## 2023-09-26 DIAGNOSIS — M62838 Other muscle spasm: Secondary | ICD-10-CM | POA: Diagnosis not present

## 2023-09-26 DIAGNOSIS — S39012A Strain of muscle, fascia and tendon of lower back, initial encounter: Secondary | ICD-10-CM | POA: Diagnosis not present

## 2023-09-27 DIAGNOSIS — E875 Hyperkalemia: Secondary | ICD-10-CM | POA: Diagnosis not present

## 2023-09-27 DIAGNOSIS — D72829 Elevated white blood cell count, unspecified: Secondary | ICD-10-CM | POA: Diagnosis not present

## 2023-10-04 DIAGNOSIS — D7589 Other specified diseases of blood and blood-forming organs: Secondary | ICD-10-CM | POA: Diagnosis not present

## 2023-10-04 DIAGNOSIS — Z0000 Encounter for general adult medical examination without abnormal findings: Secondary | ICD-10-CM | POA: Diagnosis not present

## 2023-10-04 DIAGNOSIS — L700 Acne vulgaris: Secondary | ICD-10-CM | POA: Diagnosis not present

## 2023-10-04 DIAGNOSIS — R790 Abnormal level of blood mineral: Secondary | ICD-10-CM | POA: Diagnosis not present

## 2023-10-04 DIAGNOSIS — R6889 Other general symptoms and signs: Secondary | ICD-10-CM | POA: Diagnosis not present

## 2023-10-04 DIAGNOSIS — M2550 Pain in unspecified joint: Secondary | ICD-10-CM | POA: Diagnosis not present

## 2023-10-04 DIAGNOSIS — G479 Sleep disorder, unspecified: Secondary | ICD-10-CM | POA: Diagnosis not present

## 2023-10-12 DIAGNOSIS — R61 Generalized hyperhidrosis: Secondary | ICD-10-CM | POA: Diagnosis not present

## 2023-10-12 DIAGNOSIS — R7989 Other specified abnormal findings of blood chemistry: Secondary | ICD-10-CM | POA: Diagnosis not present

## 2023-10-13 DIAGNOSIS — R7989 Other specified abnormal findings of blood chemistry: Secondary | ICD-10-CM | POA: Diagnosis not present

## 2023-10-13 DIAGNOSIS — R946 Abnormal results of thyroid function studies: Secondary | ICD-10-CM | POA: Diagnosis not present

## 2023-10-13 DIAGNOSIS — R61 Generalized hyperhidrosis: Secondary | ICD-10-CM | POA: Diagnosis not present

## 2023-10-18 ENCOUNTER — Other Ambulatory Visit: Payer: Self-pay | Admitting: Nurse Practitioner

## 2023-10-18 DIAGNOSIS — R7989 Other specified abnormal findings of blood chemistry: Secondary | ICD-10-CM

## 2023-10-24 DIAGNOSIS — G4700 Insomnia, unspecified: Secondary | ICD-10-CM | POA: Diagnosis not present

## 2023-10-24 DIAGNOSIS — Q796 Ehlers-Danlos syndrome, unspecified: Secondary | ICD-10-CM | POA: Diagnosis not present

## 2023-10-24 DIAGNOSIS — G4719 Other hypersomnia: Secondary | ICD-10-CM | POA: Diagnosis not present

## 2023-10-27 DIAGNOSIS — S39012A Strain of muscle, fascia and tendon of lower back, initial encounter: Secondary | ICD-10-CM | POA: Diagnosis not present

## 2023-10-27 DIAGNOSIS — M62838 Other muscle spasm: Secondary | ICD-10-CM | POA: Diagnosis not present

## 2023-10-31 ENCOUNTER — Ambulatory Visit
Admission: RE | Admit: 2023-10-31 | Discharge: 2023-10-31 | Disposition: A | Discharge status: Home / Self Care | Source: Ambulatory Visit | Attending: Nurse Practitioner | Admitting: Nurse Practitioner

## 2023-10-31 DIAGNOSIS — E34322 Insulin-like growth factor-1 (IGF-1) resistance: Secondary | ICD-10-CM | POA: Diagnosis not present

## 2023-10-31 DIAGNOSIS — R7989 Other specified abnormal findings of blood chemistry: Secondary | ICD-10-CM

## 2023-10-31 MED ORDER — GADOPICLENOL 0.5 MMOL/ML IV SOLN
6.0000 mL | Freq: Once | INTRAVENOUS | Status: AC | PRN
  Administered 2023-10-31: 6 mL via INTRAVENOUS

## 2023-11-08 DIAGNOSIS — Z719 Counseling, unspecified: Secondary | ICD-10-CM | POA: Diagnosis not present

## 2023-11-08 DIAGNOSIS — G4719 Other hypersomnia: Secondary | ICD-10-CM | POA: Diagnosis not present

## 2023-11-08 DIAGNOSIS — F520 Hypoactive sexual desire disorder: Secondary | ICD-10-CM | POA: Diagnosis not present

## 2023-11-14 DIAGNOSIS — Q796 Ehlers-Danlos syndrome, unspecified: Secondary | ICD-10-CM | POA: Diagnosis not present

## 2023-11-14 DIAGNOSIS — G909 Disorder of the autonomic nervous system, unspecified: Secondary | ICD-10-CM | POA: Diagnosis not present

## 2023-11-14 DIAGNOSIS — R5383 Other fatigue: Secondary | ICD-10-CM | POA: Diagnosis not present

## 2023-11-21 DIAGNOSIS — M545 Low back pain, unspecified: Secondary | ICD-10-CM | POA: Diagnosis not present

## 2023-11-21 DIAGNOSIS — R768 Other specified abnormal immunological findings in serum: Secondary | ICD-10-CM | POA: Diagnosis not present

## 2023-11-21 DIAGNOSIS — R5383 Other fatigue: Secondary | ICD-10-CM | POA: Diagnosis not present

## 2023-11-21 DIAGNOSIS — Q7962 Hypermobile Ehlers-Danlos syndrome: Secondary | ICD-10-CM | POA: Diagnosis not present

## 2023-11-27 DIAGNOSIS — S39012A Strain of muscle, fascia and tendon of lower back, initial encounter: Secondary | ICD-10-CM | POA: Diagnosis not present

## 2023-11-29 DIAGNOSIS — R7989 Other specified abnormal findings of blood chemistry: Secondary | ICD-10-CM | POA: Diagnosis not present

## 2023-12-05 DIAGNOSIS — Z23 Encounter for immunization: Secondary | ICD-10-CM | POA: Diagnosis not present

## 2023-12-05 DIAGNOSIS — Q796 Ehlers-Danlos syndrome, unspecified: Secondary | ICD-10-CM | POA: Diagnosis not present

## 2023-12-05 DIAGNOSIS — R768 Other specified abnormal immunological findings in serum: Secondary | ICD-10-CM | POA: Diagnosis not present

## 2023-12-05 DIAGNOSIS — H00014 Hordeolum externum left upper eyelid: Secondary | ICD-10-CM | POA: Diagnosis not present

## 2023-12-13 ENCOUNTER — Telehealth: Payer: Self-pay

## 2023-12-13 ENCOUNTER — Ambulatory Visit: Admitting: Family Medicine

## 2023-12-13 VITALS — BP 108/74 | HR 80 | Ht 68.0 in | Wt 146.0 lb

## 2023-12-13 DIAGNOSIS — M791 Myalgia, unspecified site: Secondary | ICD-10-CM

## 2023-12-13 DIAGNOSIS — L608 Other nail disorders: Secondary | ICD-10-CM | POA: Diagnosis not present

## 2023-12-13 DIAGNOSIS — M545 Low back pain, unspecified: Secondary | ICD-10-CM | POA: Diagnosis not present

## 2023-12-13 DIAGNOSIS — G8929 Other chronic pain: Secondary | ICD-10-CM

## 2023-12-13 DIAGNOSIS — Q7962 Hypermobile Ehlers-Danlos syndrome: Secondary | ICD-10-CM

## 2023-12-13 NOTE — Telephone Encounter (Signed)
 Left pt a VM requesting she stop by the office to sign the consent to release medical records. Apologized we missed doing this at the time of visit. Form sent to the front desk for pt's signature.

## 2023-12-13 NOTE — Patient Instructions (Addendum)
 Thank you for coming in today.   Please get labs today before you leave   I've referred you to Physical Therapy.  Let us  know if you don't hear from them in one week.   3L of water a day and 8-12 g of salt a day.   Recheck in 1 month.

## 2023-12-13 NOTE — Progress Notes (Signed)
   Diana Ileana Collet, PhD, LAT, ATC acting as a scribe for Artist Lloyd, MD.  Diana Proctor is a 23 y.o. female who presents to [facility] at [facility] today for evaluation of her hypermobility. She is also a pt of Dr. Jalene and Dr. Fernande (cardiology). Dx w/ EDS in middle school.   MS: chronic back pain, hyperexention of elbows Skin/Immune reactions: hypertrophic scarring, stretch marks Nervous system: intolerant to heat, insomnia,  Head/Spine:  CV: chronic fatigue, dysautonomia, orthostatic hypotension GI: abdominal cramping Genitourinary: none Hands & Feet: piezogenic papules  Pertinent review of systems: No fevers or chills  Relevant historical information: Phonic back pain.  Patient had MRI at [facility]   Exam:  BP 108/74   Pulse 80   Ht 5' 8 (1.727 m)   Wt 146 lb (66.2 kg)   SpO2 98%   BMI 22.20 kg/m  General: Well Developed, well nourished, and in no acute distress.   MSK: Hypermobile evaluation positive with a Beighton score 7/9. Ehlers-Danlos evaluation positive with a score of 5  Patient does have nail pitting.  Lab and Radiology Results  Reviewed labs.  ANA positive with a titer of 1: 160.  No HLA-B27 lab obtained.  MRI lumbar spine and pelvis obtained at [facility] in April of this year reportedly normal.  I do not have access to the results of this test as of the time of this note.     Assessment and Plan: 23 y.o. 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  Plan to refer to physical therapy well-versed  in hypermobility.  Will try to obtain medical records of MRI results for [facility] from a few months ago.  Talked about salt and fluid goals for POTS as well as Zyrtec and Pepcid for mast cell.  Talked about exercise goals.  She is a Development worker, international aid.  Additionally will  proceed with genetic testing.  Will use the connective tissue panel through through [employer].  Recheck in 1 month  PDMP not reviewed this encounter. Orders Placed This Encounter  Procedures   HLA-B27 antigen    Polyarthalgia    Standing Status:   Future    Number of Occurrences:   1    Expiration Date:   12/12/2024   Ambulatory referral to Physical Therapy    Referral Priority:   Routine    Referral Type:   Physical Medicine    Referral Reason:   Specialty Services Required    Requested Specialty:   Physical Therapy    Number of Visits Requested:   1   No orders of the defined types were placed in this encounter.    Discussed warning signs or symptoms. Please see discharge instructions. Patient expresses understanding.   The above documentation has been reviewed and is accurate and complete Artist Lloyd, M.D.

## 2023-12-14 LAB — HLA-B27 ANTIGEN: HLA-B27 Antigen: NEGATIVE

## 2023-12-15 ENCOUNTER — Ambulatory Visit: Payer: Self-pay | Admitting: Family Medicine

## 2023-12-15 NOTE — Progress Notes (Signed)
 HLA-B27 is normal. I will double check with my staff to make sure that we are ordering the genetic testing.  I do believe with that we have ordered a yesterday but I will double check.

## 2023-12-18 DIAGNOSIS — R7989 Other specified abnormal findings of blood chemistry: Secondary | ICD-10-CM | POA: Diagnosis not present

## 2023-12-21 ENCOUNTER — Ambulatory Visit: Admitting: Family Medicine

## 2023-12-27 DIAGNOSIS — Q7962 Hypermobile Ehlers-Danlos syndrome: Secondary | ICD-10-CM | POA: Diagnosis not present

## 2023-12-27 DIAGNOSIS — S39012A Strain of muscle, fascia and tendon of lower back, initial encounter: Secondary | ICD-10-CM | POA: Diagnosis not present

## 2023-12-27 DIAGNOSIS — M62838 Other muscle spasm: Secondary | ICD-10-CM | POA: Diagnosis not present

## 2024-01-03 ENCOUNTER — Encounter: Payer: Self-pay | Admitting: Family Medicine

## 2024-01-08 ENCOUNTER — Encounter: Payer: Self-pay | Admitting: Family Medicine

## 2024-01-08 ENCOUNTER — Ambulatory Visit (INDEPENDENT_AMBULATORY_CARE_PROVIDER_SITE_OTHER): Admitting: Physical Therapy

## 2024-01-08 DIAGNOSIS — M357 Hypermobility syndrome: Secondary | ICD-10-CM | POA: Diagnosis not present

## 2024-01-08 DIAGNOSIS — M5459 Other low back pain: Secondary | ICD-10-CM

## 2024-01-08 NOTE — Therapy (Signed)
 OUTPATIENT PHYSICAL THERAPY HYPERMOBILITY EVAL  Patient Name: Diana Proctor MRN: [id number] DOB:07/18/2000, 23 y.o., female Today's Date: 01/08/2024  END OF SESSION:  PT End of Session - 01/08/24 0854     Visit Number 1    Number of Visits 8    Date for Recertification  02/19/24   allow 6 weeks for scheduling   Authorization Type BCBS    PT Start Time [phone]    PT Stop Time 0935    PT Time Calculation (min) 46 min    Activity Tolerance Patient tolerated treatment well    Behavior During Therapy [facility] for tasks assessed/performed          Past Medical History:  Diagnosis Date   Headache    Past Surgical History:  Procedure Laterality Date   LAPAROSCOPIC OVARIAN CYSTECTOMY Left 09/04/2020   Procedure: LAPAROSCOPIC OVARIAN CYSTECTOMY;  Surgeon: Barbette Knock, MD;  Location: [facility] OR;  Service: Gynecology;  Laterality: Left;   NO PAST SURGERIES     Patient Active Problem List   Diagnosis Date Noted   Dysautonomia (HCC) 09/15/2020   Fever of unknown origin 02/28/2020   Disequilibrium 05/17/2019   Migraine without aura and without status migrainosus, not intractable 04/25/2019   Episodic tension-type headache, not intractable 04/25/2019   Central positional vertigo 04/25/2019   Orthostatic hypotension 03/02/2016   Closed head injury without loss of consciousness 12/31/2015   Posttraumatic headache 12/31/2015   Post concussion syndrome 12/31/2015   Adjustment disorder with mixed anxiety and depressed mood 01/08/2013   Acne 01/08/2013   Sleep disturbance 01/08/2013    PCP: Rolinda [facility]man MD   REFERRING PROVIDER: Joane Birmingham MD   REFERRING DIAG: [phone] (ICD-10-CM) - Hypermobile Ehlers-Danlos syndrome M79.10 (ICD-10-CM) - Myalgia  Rationale for Evaluation and Treatment: Rehabilitation  THERAPY DIAG:  Hypermobility syndrome  Other low back pain  ONSET DATE: chronic  SUBJECTIVE:                                                                                                                                                                                            SUBJECTIVE STATEMENT: Patient has a diagnosis of hypermobility syndrome Ehlers-Danlos.  She was referred by Dr. Joane for this condition the patient states she has had PT a couple of times when she was younger for her hips knees and most recently her back.  PT helped to a certain degree but reports that once she went to the chiropractor's when she noticed a definite improvement in her back.  Her current complaint of lower back pain has been ongoing ongoing since last year.  There is no specific injury but  the pain became more intense and has fluctuated over the past year.  She reports she is currently mostly back to normal but the pain remains consistent on an every day basis more annoying than anything.  She reports that it is not necessarily  stopping her from doing anything but knows that she has to stretch every day and be mindful of her back with activity. She has seen Dr. Orpha over at [facility] who confirmed she has shallow hip sockets, has had an MRI which they reported is normal in addition to 3 separate spinal injections.  She did not really receive any benefit from those injections unfortunately. Dr. Joane has requested MRI copy. 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  She currently lifts weights about 3 times a week.  She is skulling (bilateral) now but rowed on the [place] side (Rt sidebending) pulling with L UE  Denies radicular sx or weakness or sensory changes.   Patient reports symptoms and/or instability in the following areas:  - Cervical Spine: [] Pain [] Instability [] Limited ROM [] Paresthesia  - Thoracic Spine: [x] Pain [] Instability  - Lumbar Spine: [x] Pain [x] Instability [] Frequent locking/giving out  - Shoulder: [] L [] R  [] Subluxation [] Dislocations [] Pain [] Fatigue  - Elbow: [] L [] R  [] Instability [] Hyperextension [] Pain  - Wrist/Hand: [] L [] R   [] Instability [] Fatigue with use [] Pain  - Hip: [x] L [x] R  [] Instability [x] Pain [] Clicking [] Gait deviations  - Knee: [x] L [x] R  [x] Instability [] Hyperextension [] Pain  - Ankle/Foot: [] L [] R  [] Instability [x] Frequent sprains Pain   Does the patient have a diagnosis of any of the following: Fatigue[x]  Brain fog[x]  Lightheadedness[x]  Allergies[]  GI[]  Neurodiverse[x] ADD  Additional Notes:  ______________________________________    PERTINENT HISTORY:   Sees Cardiology (Dr. 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     Relevant historical information:    PAIN:  Are you having pain? Yes: NPRS scale: 3/10 Pain location: back pain, central and Rt low back (can alternate)   Pain description: aching  Aggravating factors: standing too long, certain sitting positions Relieving factors: stretching, changing positions.    Rowing makes her back feel sore, but it goes away  PRECAUTIONS: Other: none , monitor dizziness  RED FLAGS: None   WEIGHT BEARING RESTRICTIONS: No  FALLS:  Has patient fallen in last 6 months? 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   NEXT MD VISIT: sees Dr. Herschel We.   OBJECTIVE:  Note: Objective measures were completed at Evaluation unless otherwise noted.  DIAGNOSTIC FINDINGS:  MRI normal- results requested   CARDIO/ORTHOSTATICS: NT on eval  Baseline RHR Standing HR Baseline BP Standing BP   PATIENT SURVEYS:  FAS VS LEFS vs MODI NT on eval   COGNITION: Overall cognitive status: Within functional limits for tasks  assessed     SENSATION: WFL Notices asymmetry in muscle activation  POSTURE: rounded shoulders and slumped sitting, can correct easily    PALPATION: Hypermobile through lumbar segments L2-L3-L4-L5 Palpation to sacrum produces pain mid lumbar. Hypertonic lumbar extensors   LUMBAR ROM:   AROM eval  Flexion Palms flat  Extension Discomfort  central   Right lateral flexion WFL with pain contra  Left lateral flexion WFL with pain contra  Right rotation WFL  Left rotation WFL   (Blank rows = not tested)  LOWER EXTREMITY ROM:     Passive  Right eval Left eval  Hip flexion hyper hyper  Hip extension    Hip abduction    Hip adduction    Hip internal rotation WNL WNL  Hip external rotation WNL WNL   Knee flexion    Knee extension    Ankle dorsiflexion    Ankle plantarflexion    Ankle inversion    Ankle eversion     (Blank rows = not tested)  LOWER EXTREMITY MMT:    L hip weaker in abd   MMT Right eval Left eval  Hip flexion 5 5  Hip extension 5 4+  Hip abduction 5 4+  Hip adduction    Hip internal rotation    Hip external rotation    Knee flexion    Knee extension    Ankle dorsiflexion    Ankle plantarflexion    Ankle inversion    Ankle eversion     (Blank rows = not tested)  LUMBAR SPECIAL TESTS:  Straight leg raise test: Negative, Trendelenburg sign: Negative, and lumbar extension: loss of neutral spine and rectus abdominus dominates    FUNCTIONAL TESTS:  Apparent instability in lumbar with alternating hip extension                      Pain in lumbar with prone knee flexion More patient able to activate multifidus on right side better than the left Did not test Beighton score    Beighton Scale Lumbar (_/1) Knees (_/2) Elbows (_/2)  5th digit (_2) Thumb (_/2) Comment on hips, shoulders    GAIT: Comments: No deviations  TREATMENT DATE:   [facility] Adult PT Treatment:                                                DATE: 01/08/24  Self  Care: Discussed plan of care ,neutral spine, anatomy of the deep core muscles (Rectus abdominis versus transverse abdominis)                                                                                                                         PATIENT EDUCATION:  Education details: see above  Person educated: Patient and Parent Education method: Explanation, Demonstration, and Handouts Education comprehension: verbalized understanding, returned demonstration, and needs further education      HOME EXERCISE PROGRAM: NA on eval  ASSESSMENT:  CLINICAL IMPRESSION: Patient is a 23y.o. female who was seen today for physical therapy evaluation and treatment for hypermobile Ehlers-Danlos syndrome with lower back pain.  Assessment focused on the lumbar spine today but will continue to monitor patient for hip  pain or other issues.   OBJECTIVE IMPAIRMENTS: difficulty walking, decreased strength, increased fascial restrictions, improper body mechanics, pain, and hypermobility.   ACTIVITY LIMITATIONS: carrying, lifting, bending, sitting, standing, squatting, and locomotion level  PARTICIPATION LIMITATIONS: community activity and occupation  PERSONAL FACTORS: Time since onset of injury/illness/exacerbation and 3+ comorbidities: POTS, chronic pain, genetic condition  are also affecting patient's functional outcome.   REHAB POTENTIAL: Excellent  CLINICAL DECISION MAKING: Stable/uncomplicated  EVALUATION COMPLEXITY: Low   GOALS: Goals reviewed with patient? Yes  SHORT TERM GOALS=LONG TERM GOALS: Target date: 02/19/2024    Patient will be independent with final HEP upon discharge from PT and report consistent benefit following exercise completion.    Baseline:  Goal status: INITIAL  2.  Patient will be able to demonstrate proper posture and lifting techniques related to spine health and reduction of symptoms.   Baseline:  Goal status: INITIAL  3.  Patient will be I with concepts of  joint protection and stability as it pertains to joint hypermobility.  Baseline:  Goal status: INITIAL  4.  Patient will be able to demonstrate proper deep core activation for basic and intermediate core stabilization exercises. Baseline:  Goal status: INITIAL  5.  Patient will report no pain in her lower back after lifting in the gym Baseline:  Goal status: INITIAL  PLAN:  PT FREQUENCY: 2x/week  PT DURATION: 4 weeks  PLANNED INTERVENTIONS: [id number]- PT Re-evaluation, 97750- Physical Performance Testing, 97110-Therapeutic exercises, 97530- Therapeutic activity, [id number]- Neuromuscular re-education, 97535- Self Care, [id number]- Manual therapy, Patient/Family education, Taping, Joint mobilization, Spinal mobilization, Cryotherapy, and Moist heat.  PLAN FOR NEXT SESSION: Initiate neutral spine flat back core exercises for HEP.  Watch simulation of rowing on reformer and other Pilates ex    Ilija Maxim, PT 01/08/2024, 10:40 AM

## 2024-01-10 ENCOUNTER — Ambulatory Visit: Admitting: Family Medicine

## 2024-01-10 VITALS — BP 122/70 | HR 64 | Ht 68.0 in | Wt 149.0 lb

## 2024-01-10 DIAGNOSIS — Q7962 Hypermobile Ehlers-Danlos syndrome: Secondary | ICD-10-CM | POA: Diagnosis not present

## 2024-01-10 DIAGNOSIS — D8940 Mast cell activation, unspecified: Secondary | ICD-10-CM | POA: Diagnosis not present

## 2024-01-10 DIAGNOSIS — I951 Orthostatic hypotension: Secondary | ICD-10-CM | POA: Diagnosis not present

## 2024-01-10 NOTE — Progress Notes (Signed)
   I, Claretha Schimke am a scribe for Dr. Artist Lloyd, MD.  Diana Proctor is a 23 y.o. female who presents to [facility] at [facility] today for 72-month f/u hEDS. Pt was last seen by Dr. Lloyd on 12/13/23 and was referred to PT, completing 1 visit. Advised on salt/fluid intake, exercise, Zyrtec, Pepcid, and we tried to obtain records from [facility]. [facility] genetic testing was also ordered.  Today, pt reports that she is fine right now. Since the last visit no new issues just the normal issues. Salt/ fluid intake is doing well. Drinking 3 32oz bottles with the electrolyte packets a day.   She notes continued heat intolerance lightheadedness and an adequate performance with rowing.  She is a environmental consultant.  Pertinent review of systems: No fevers or chills  Relevant historical information: Acne managed with spironolactone   Exam:  BP 122/70   Pulse 64   Ht 5' 8 (1.727 m)   Wt 149 lb (67.6 kg)   SpO2 98%   BMI 22.66 kg/m  General: Well Developed, well nourished, and in no acute distress.   MSK: Normal motion and gait    Lab and Radiology Results No results found for this or any previous visit (from the past 72 hours). No results found.     Assessment and Plan: 23 y.o. female with Ehlers-Danlos syndrome with medical comorbidities of dysautonomia/POTS and mast cell activation syndrome.  Recommend discontinuing spironolactone.  This could be interfering with POTS management.  Consider starting propranolol or metoprolol.  This may actually improve her performance and improve her heat intolerance.  She will think about it and let me know.  Happy to give it a try before she comes back.  For mast cell already taking Allegra recommend taking famotidine twice daily if ineffective will add montelukast.  Continue salt and exercise and fluids for POTS.  Recheck in a month.   PDMP not reviewed this encounter. No orders of the defined types  were placed in this encounter.  No orders of the defined types were placed in this encounter.    Discussed warning signs or symptoms. Please see discharge instructions. Patient expresses understanding.   The above documentation has been reviewed and is accurate and complete Artist Lloyd, M.D.

## 2024-01-10 NOTE — Patient Instructions (Signed)
 Thank you for coming in today.   Stop spirolactone  Continue famotide twice daily  Let me know what your friend is taking.  Check back in 1 month

## 2024-01-11 ENCOUNTER — Encounter: Payer: Self-pay | Admitting: Family Medicine

## 2024-01-16 ENCOUNTER — Encounter: Payer: Self-pay | Admitting: Physical Therapy

## 2024-01-16 ENCOUNTER — Ambulatory Visit (INDEPENDENT_AMBULATORY_CARE_PROVIDER_SITE_OTHER): Admitting: Physical Therapy

## 2024-01-16 DIAGNOSIS — M5459 Other low back pain: Secondary | ICD-10-CM

## 2024-01-16 DIAGNOSIS — M357 Hypermobility syndrome: Secondary | ICD-10-CM | POA: Diagnosis not present

## 2024-01-16 NOTE — Therapy (Signed)
 OUTPATIENT PHYSICAL THERAPY NOTE  Patient Name: BARBRA MINER MRN: [id number] DOB:January 10, 2001, 23 y.o., female Today's Date: 01/16/2024  END OF SESSION:  PT End of Session - 01/16/24 0850     Visit Number 2    Number of Visits 8    Date for Recertification  02/19/24    Authorization Type BCBS    PT Start Time 0848    PT Stop Time 0930    PT Time Calculation (min) 42 min    Activity Tolerance Patient tolerated treatment well    Behavior During Therapy [facility] for tasks assessed/performed           Past Medical History:  Diagnosis Date   Headache    Past Surgical History:  Procedure Laterality Date   LAPAROSCOPIC OVARIAN CYSTECTOMY Left 09/04/2020   Procedure: LAPAROSCOPIC OVARIAN CYSTECTOMY;  Surgeon: Barbette Knock, MD;  Location: [facility] OR;  Service: Gynecology;  Laterality: Left;   NO PAST SURGERIES     Patient Active Problem List   Diagnosis Date Noted   Dysautonomia (HCC) 09/15/2020   Fever of unknown origin 02/28/2020   Disequilibrium 05/17/2019   Migraine without aura and without status migrainosus, not intractable 04/25/2019   Episodic tension-type headache, not intractable 04/25/2019   Central positional vertigo 04/25/2019   Orthostatic hypotension 03/02/2016   Closed head injury without loss of consciousness 12/31/2015   Posttraumatic headache 12/31/2015   Post concussion syndrome 12/31/2015   Adjustment disorder with mixed anxiety and depressed mood 01/08/2013   Acne 01/08/2013   Sleep disturbance 01/08/2013    PCP: Rolinda Millman MD   REFERRING PROVIDER: Joane Birmingham MD   REFERRING DIAG: [phone] (ICD-10-CM) - Hypermobile Ehlers-Danlos syndrome M79.10 (ICD-10-CM) - Myalgia  Rationale for Evaluation and Treatment: Rehabilitation  THERAPY DIAG:  Hypermobility syndrome  Other low back pain  ONSET DATE: chronic  SUBJECTIVE:                                                                                                                                                                                            SUBJECTIVE STATEMENT: I have felt tired since last Thursday. Weak all over. I feel like I'm heading towards a really bad flare.  Racing this weekend and next weekend.  Had a race this past weekend   Patient has a diagnosis of hypermobility syndrome Ehlers-Danlos.  She was referred by Dr. Joane for this condition the patient states she has had PT a couple of times when she was younger for her hips knees and most recently her back.  PT helped to a certain degree but reports that once she went to the chiropractor's when  she noticed a definite improvement in her back.  Her current complaint of lower back pain has been ongoing ongoing since last year.  There is no specific injury but the pain became more intense and has fluctuated over the past year.  She reports she is currently mostly back to normal but the pain remains consistent on an every day basis more annoying than anything.  She reports that it is not necessarily  stopping her from doing anything but knows that she has to stretch every day and be mindful of her back with activity. She has seen Dr. Orpha over at [facility] who confirmed she has shallow hip sockets, has had an MRI which they reported is normal in addition to 3 separate spinal injections.  She did not really receive any benefit from those injections unfortunately. Dr. Joane has requested MRI copy. 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  She currently lifts weights about 3 times a week.  She is skulling (bilateral) now but rowed on the [place] side (Rt sidebending) pulling with L UE  Denies radicular sx or weakness or sensory changes.   Patient reports symptoms and/or instability in the following areas:  - Cervical Spine: [] Pain [] Instability [] Limited ROM [] Paresthesia  - Thoracic Spine: [x] Pain [] Instability  - Lumbar Spine: [x] Pain [x] Instability [] Frequent locking/giving out  - Shoulder: [] L  [] R  [] Subluxation [] Dislocations [] Pain [] Fatigue  - Elbow: [] L [] R  [] Instability [] Hyperextension [] Pain  - Wrist/Hand: [] L [] R  [] Instability [] Fatigue with use [] Pain  - Hip: [x] L [x] R  [] Instability [x] Pain [] Clicking [] Gait deviations  - Knee: [x] L [x] R  [x] Instability [] Hyperextension [] Pain  - Ankle/Foot: [] L [] R  [] Instability [x] Frequent sprains Pain   Does the patient have a diagnosis of any of the following: Fatigue[x]  Brain fog[x]  Lightheadedness[x]  Allergies[]  GI[]  Neurodiverse[x] ADD  Additional Notes:  ______________________________________    PERTINENT HISTORY:   Sees Cardiology (Dr. 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     Relevant historical information:    PAIN:  Are you having pain? Yes: NPRS scale: 3/10 Pain location: back pain, central and Rt low back (can alternate)   Pain description: aching  Aggravating factors: standing too long, certain sitting positions, intense workouts  Relieving factors: stretching, changing positions.    Rowing makes her back feel sore, but it goes away  PRECAUTIONS: Other: none , monitor dizziness  RED FLAGS: None   WEIGHT BEARING RESTRICTIONS: No  FALLS:  Has patient fallen in last 6 months? 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   NEXT MD VISIT: sees Dr. Herschel Heidelberg.   OBJECTIVE:  Note: Objective measures were completed at Evaluation unless otherwise noted.  DIAGNOSTIC FINDINGS:  MRI normal- results requested   CARDIO/ORTHOSTATICS: NT on eval  Baseline RHR Standing  HR Baseline BP Standing BP   PATIENT SURVEYS:  FAS VS LEFS vs MODI NT on eval   COGNITION: Overall cognitive status: Within functional limits for tasks assessed     SENSATION: WFL Notices asymmetry in muscle activation  POSTURE: rounded shoulders and slumped sitting, can correct easily  PALPATION: Hypermobile through lumbar segments L2-L3-L4-L5 Palpation to sacrum produces pain mid lumbar. Hypertonic lumbar extensors   LUMBAR ROM:   AROM eval  Flexion Palms flat  Extension Discomfort central   Right lateral flexion WFL with pain contra  Left lateral flexion WFL with pain contra  Right rotation WFL  Left rotation WFL   (Blank rows = not tested)  LOWER EXTREMITY ROM:     Passive  Right eval Left eval  Hip flexion hyper hyper  Hip extension    Hip abduction    Hip adduction    Hip internal rotation WNL WNL  Hip external rotation WNL WNL   Knee flexion    Knee extension    Ankle dorsiflexion    Ankle plantarflexion    Ankle inversion    Ankle eversion     (Blank rows = not tested)  LOWER EXTREMITY MMT:    L hip weaker in abd   MMT Right eval Left eval  Hip flexion 5 5  Hip extension 5 4+  Hip abduction 5 4+  Hip adduction    Hip internal rotation    Hip external rotation    Knee flexion    Knee extension    Ankle dorsiflexion    Ankle plantarflexion    Ankle inversion    Ankle eversion     (Blank rows = not tested)  LUMBAR SPECIAL TESTS:  Straight leg raise test: Negative, Trendelenburg sign: Negative, and lumbar extension: loss of neutral spine and rectus abdominus dominates    FUNCTIONAL TESTS:  Apparent instability in lumbar with alternating hip extension                      Pain in lumbar with prone knee flexion More patient able to activate multifidus on right side better than the left Did not test Beighton score    Beighton Scale Lumbar (_/1) Knees (_/2) Elbows (_/2)  5th digit (_2) Thumb (_/2) Comment on hips, shoulders     GAIT: Comments: No deviations  TREATMENT DATE:  [facility] Adult PT Treatment:                                                DATE: 01/16/24 Neuromuscular re-ed: Used soft Pilates ball for neutral core  A/P tilt with TrA  March Bent knee fall out  90/90 knee extension with and without ball  90/90 Bridging  Seated roll down with and without band for A  Childs pose with focus on lower spine flexion Round back monster plank  x 30 sec  Manual therapy: Prone soft tissue work to lumbar paraspinals, QL bilaterally   [facility] Adult PT Treatment:                                                DATE: 01/08/24  Self Care: Discussed plan of care ,neutral spine, anatomy of the deep core muscles (Rectus abdominis versus transverse abdominis)  PATIENT EDUCATION:  Education details: see above  Person educated: Patient and Parent Education method: Explanation, Demonstration, and Handouts Education comprehension: verbalized understanding, returned demonstration, and needs further education   HOME EXERCISE PROGRAM: Access Code: [email or web address] URL: [email or web address] Date: 01/16/2024 Prepared by: Delon Norma  Exercises - Supine 90/90 with Leg Extensions  - 1 x daily - 7 x weekly - 2 sets - 10 reps - 5 hold - Pilates Bridge  - 1 x daily - 7 x weekly - 2 sets - 10 reps - 5 hold - Bear Plank from Quadruped  - 1 x daily - 7 x weekly - 1 sets - 5 reps - 30 hold - Quadruped Pelvic Floor Contraction with Opposite Arm and Leg Lift  - 1 x daily - 7 x weekly - 2 sets - 10 reps - 5 hold   ASSESSMENT:  CLINICAL IMPRESSION:  Patient given a basic neutral spine HEP but also does respond well to flexion based exercises.  She has good muscle tone in her paraspinals (hypertonic?) and flexion can release that for her.  She is generally feeling weaker for the past few days but it  did not interfere with the session today.    OBJECTIVE IMPAIRMENTS: difficulty walking, decreased strength, increased fascial restrictions, improper body mechanics, pain, and hypermobility.   ACTIVITY LIMITATIONS: carrying, lifting, bending, sitting, standing, squatting, and locomotion level  PARTICIPATION LIMITATIONS: community activity and occupation  PERSONAL FACTORS: Time since onset of injury/illness/exacerbation and 3+ comorbidities: POTS, chronic pain, genetic condition  are also affecting patient's functional outcome.   REHAB POTENTIAL: Excellent  CLINICAL DECISION MAKING: Stable/uncomplicated  EVALUATION COMPLEXITY: Low   GOALS: Goals reviewed with patient? Yes  SHORT TERM GOALS=LONG TERM GOALS: Target date: 02/19/2024    Patient will be independent with final HEP upon discharge from PT and report consistent benefit following exercise completion.    Baseline:  Goal status: INITIAL  2.  Patient will be able to demonstrate proper posture and lifting techniques related to spine health and reduction of symptoms.   Baseline:  Goal status: INITIAL  3.  Patient will be I with concepts of joint protection and stability as it pertains to joint hypermobility. Baseline:  Goal status: INITIAL  4.  Patient will be able to demonstrate proper deep core activation for basic and intermediate core stabilization exercises. Baseline:  Goal status: INITIAL  5.  Patient will report no pain in her lower back after lifting in the gym Baseline:  Goal status: INITIAL  PLAN:  PT FREQUENCY: 2x/week  PT DURATION: 4 weeks  PLANNED INTERVENTIONS: 97164- PT Re-evaluation, 97750- Physical Performance Testing, 97110-Therapeutic exercises, 97530- Therapeutic activity, [id number]- Neuromuscular re-education, 97535- Self Care, [id number]- Manual therapy, Patient/Family education, Taping, Joint mobilization, Spinal mobilization, Cryotherapy, and Moist heat.  PLAN FOR NEXT SESSION: Initiate neutral spine  flat back core exercises for HEP.  Watch simulation of rowing on reformer and other Pilates ex    Anas Reister, PT 01/16/2024, 12:26 PM   Delon Norma, PT 01/16/24 12:26 PM Phone: [phone] Fax: [phone]

## 2024-01-18 NOTE — Therapy (Unsigned)
 OUTPATIENT PHYSICAL THERAPY NOTE  Patient Name: Diana Proctor MRN: [id number] DOB:2000/10/11, 23 y.o., female Today's Date: 01/19/2024  END OF SESSION:  PT End of Session - 01/19/24 0806     Visit Number 3    Number of Visits 8    Date for Recertification  02/19/24    Authorization Type BCBS    PT Start Time 0803    PT Stop Time 0845    PT Time Calculation (min) 42 min    Activity Tolerance Patient tolerated treatment well    Behavior During Therapy [facility] for tasks assessed/performed            Past Medical History:  Diagnosis Date   Headache    Past Surgical History:  Procedure Laterality Date   LAPAROSCOPIC OVARIAN CYSTECTOMY Left 09/04/2020   Procedure: LAPAROSCOPIC OVARIAN CYSTECTOMY;  Surgeon: Barbette Knock, MD;  Location: [facility] OR;  Service: Gynecology;  Laterality: Left;   NO PAST SURGERIES     Patient Active Problem List   Diagnosis Date Noted   Dysautonomia (HCC) 09/15/2020   Fever of unknown origin 02/28/2020   Disequilibrium 05/17/2019   Migraine without aura and without status migrainosus, not intractable 04/25/2019   Episodic tension-type headache, not intractable 04/25/2019   Central positional vertigo 04/25/2019   Orthostatic hypotension 03/02/2016   Closed head injury without loss of consciousness 12/31/2015   Posttraumatic headache 12/31/2015   Post concussion syndrome 12/31/2015   Adjustment disorder with mixed anxiety and depressed mood 01/08/2013   Acne 01/08/2013   Sleep disturbance 01/08/2013    PCP: Rolinda Millman MD   REFERRING PROVIDER: Joane Birmingham MD   REFERRING DIAG: [phone] (ICD-10-CM) - Hypermobile Ehlers-Danlos syndrome M79.10 (ICD-10-CM) - Myalgia  Rationale for Evaluation and Treatment: Rehabilitation  THERAPY DIAG:  Hypermobility syndrome  Other low back pain  ONSET DATE: chronic  SUBJECTIVE:                                                                                                                                                                                            SUBJECTIVE STATEMENT:   I don't feel the core in that plank you gave me.    Patient reports symptoms and/or instability in the following areas:  - Cervical Spine: [] Pain [] Instability [] Limited ROM [] Paresthesia  - Thoracic Spine: [x] Pain [] Instability  - Lumbar Spine: [x] Pain [x] Instability [] Frequent locking/giving out  - Shoulder: [] L [] R  [] Subluxation [] Dislocations [] Pain [] Fatigue  - Elbow: [] L [] R  [] Instability [] Hyperextension [] Pain  - Wrist/Hand: [] L [] R  [] Instability [] Fatigue with use [] Pain  - Hip: [x] L [x] R  [] Instability [x] Pain [] Clicking [] Gait deviations  - Knee: [x] L [  x]R  [x] Instability [] Hyperextension [] Pain  - Ankle/Foot: [] L [] R  [] Instability [x] Frequent sprains Pain   Does the patient have a diagnosis of any of the following: Fatigue[x]  Brain fog[x]  Lightheadedness[x]  Allergies[]  GI[]  Neurodiverse[x] ADD  Additional Notes:  ______________________________________    PERTINENT HISTORY:   Sees Cardiology (Dr. 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     Relevant historical information:    PAIN:  Are you having pain? Yes: NPRS scale: 3/10 Pain location: back pain, central and Rt low back (can alternate)   Pain description: aching  Aggravating factors: standing too long, certain sitting positions, intense workouts  Relieving factors: stretching, changing positions.    Rowing makes her back feel sore, but it goes away  PRECAUTIONS: Other: none , monitor dizziness  RED FLAGS: None   WEIGHT BEARING RESTRICTIONS: No  FALLS:  Has patient fallen in last 6 months? 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   NEXT MD VISIT: sees Dr. Joane Heidelberg.   OBJECTIVE:  Note: Objective measures were completed at Evaluation unless otherwise noted.  DIAGNOSTIC FINDINGS:  MRI normal- results requested   CARDIO/ORTHOSTATICS: NT on eval  Baseline RHR Standing HR Baseline BP Standing BP   PATIENT SURVEYS:  FAS VS LEFS vs MODI NT on eval   COGNITION: Overall cognitive status: Within functional limits for tasks assessed     SENSATION: WFL Notices asymmetry in muscle activation  POSTURE: rounded shoulders and slumped sitting, can correct easily    PALPATION: Hypermobile through lumbar segments L2-L3-L4-L5 Palpation to sacrum produces pain mid lumbar. Hypertonic lumbar extensors   LUMBAR ROM:   AROM eval  Flexion Palms flat  Extension Discomfort central   Right lateral flexion WFL with pain contra  Left lateral flexion WFL with pain contra  Right rotation WFL  Left rotation WFL   (Blank rows = not tested)  LOWER EXTREMITY ROM:     Passive  Right eval Left eval  Hip flexion hyper hyper  Hip extension    Hip abduction    Hip adduction    Hip internal rotation WNL WNL  Hip external rotation WNL WNL   Knee flexion    Knee extension    Ankle dorsiflexion    Ankle plantarflexion    Ankle inversion    Ankle eversion     (Blank rows = not tested)  LOWER EXTREMITY MMT:    L hip weaker in abd   MMT Right eval Left eval  Hip flexion 5 5  Hip extension 5 4+  Hip abduction 5 4+  Hip adduction    Hip internal rotation    Hip external rotation    Knee flexion    Knee extension    Ankle dorsiflexion    Ankle plantarflexion    Ankle inversion    Ankle eversion     (Blank rows = not tested)  LUMBAR SPECIAL TESTS:  Straight leg raise test: Negative, Trendelenburg sign: Negative, and lumbar extension: loss of  neutral spine and rectus abdominus dominates    FUNCTIONAL TESTS:  Apparent instability in lumbar with alternating hip extension  Pain in lumbar with prone knee flexion More patient able to activate multifidus on right side better than the left Did not test Beighton score    Beighton Scale Lumbar (_/1) Knees (_/2) Elbows (_/2)  5th digit (_2) Thumb (_/2) Comment on hips, shoulders    GAIT: Comments: No deviations  TREATMENT DATE:   [facility] Adult PT Treatment:                                                DATE: 01/19/24 Neuromuscular re-ed: Pilates Reformer used for LE/core strength, postural strength, lumbopelvic disassociation and core control.  Exercises included: Footwork  Double leg Parallel heels, toes narrow and wide Pilates V heels and toes narrow and wide  2 red 1 blue 1 yellow   Single leg  Heel in parallel and turnout Double leg heel raise and calf stretch, prancing  Bridging 2 red 1 blue x 10  Supine Arm Arcs 1 red 1 yellow  Circles x 8 each direction   Feet in Straps 1 red 1 yellow Arcs parallel and in V  Frog squats x 10  Quadruped 1 red UE x 5 then added LE x 5 each Kneeling plank press out x 10  Kneeling monster plank 1 red spring, done on short box, used forearms, ball , round spine   [facility] Adult PT Treatment:                                                DATE: 01/16/24 Neuromuscular re-ed: Used soft Pilates ball for neutral core  A/P tilt with TrA  March Bent knee fall out  90/90 knee extension with and without ball  90/90 Bridging  Seated roll down with and without band for A  Childs pose with focus on lower spine flexion Round back monster plank  x 30 sec  Manual therapy: Prone soft tissue work to lumbar paraspinals, QL bilaterally   [facility] Adult PT Treatment:                                                DATE: 01/08/24  Self Care: Discussed plan of care ,neutral spine, anatomy of the deep core muscles (Rectus  abdominis versus transverse abdominis)                                                                                                                         PATIENT EDUCATION:  Education details: see above  Person educated: Patient and Parent Education method: Explanation, Demonstration, and Handouts Education comprehension: verbalized understanding, returned demonstration, and needs further education  HOME EXERCISE PROGRAM: Access Code: [email or web address] URL: [email or web address] Date: 01/16/2024 Prepared by: Delon Norma  Exercises - Supine 90/90 with Leg Extensions  - 1 x daily - 7 x weekly - 2 sets - 10 reps - 5 hold - Pilates Bridge  - 1 x daily - 7 x weekly - 2 sets - 10 reps - 5 hold - Bear Plank from Quadruped  - 1 x daily - 7 x weekly - 1 sets - 5 reps - 30 hold - Quadruped Pelvic Floor Contraction with Opposite Arm and Leg Lift  - 1 x daily - 7 x weekly - 2 sets - 10 reps - 5 hold   ASSESSMENT:  CLINICAL IMPRESSION: Pt was introduced to Pilates Reformer for focus on core activation and neuromuscular re-education.  She was finally able to feel core when on her forearms.  She will continue to benefit from PT in order to cont building core stability and proprioception.    Patient given a basic neutral spine HEP but also does respond well to flexion based exercises.  She has good muscle tone in her paraspinals (hypertonic?) and flexion can release that for her.  She is generally feeling weaker for the past few days but it did not interfere with the session today.    OBJECTIVE IMPAIRMENTS: difficulty walking, decreased strength, increased fascial restrictions, improper body mechanics, pain, and hypermobility.   ACTIVITY LIMITATIONS: carrying, lifting, bending, sitting, standing, squatting, and locomotion level  PARTICIPATION LIMITATIONS: community activity and occupation  PERSONAL FACTORS: Time since onset of injury/illness/exacerbation and 3+ comorbidities: POTS,  chronic pain, genetic condition  are also affecting patient's functional outcome.   REHAB POTENTIAL: Excellent  CLINICAL DECISION MAKING: Stable/uncomplicated  EVALUATION COMPLEXITY: Low   GOALS: Goals reviewed with patient? Yes  SHORT TERM GOALS=LONG TERM GOALS: Target date: 02/19/2024    Patient will be independent with final HEP upon discharge from PT and report consistent benefit following exercise completion.    Baseline:  Goal status: INITIAL  2.  Patient will be able to demonstrate proper posture and lifting techniques related to spine health and reduction of symptoms.   Baseline:  Goal status: INITIAL  3.  Patient will be I with concepts of joint protection and stability as it pertains to joint hypermobility. Baseline:  Goal status: INITIAL  4.  Patient will be able to demonstrate proper deep core activation for basic and intermediate core stabilization exercises. Baseline:  Goal status: INITIAL  5.  Patient will report no pain in her lower back after lifting in the gym Baseline:  Goal status: INITIAL  PLAN:  PT FREQUENCY: 2x/week  PT DURATION: 4 weeks  PLANNED INTERVENTIONS: 97164- PT Re-evaluation, 97750- Physical Performance Testing, 97110-Therapeutic exercises, 97530- Therapeutic activity, [id number]- Neuromuscular re-education, 97535- Self Care, [id number]- Manual therapy, Patient/Family education, Taping, Joint mobilization, Spinal mobilization, Cryotherapy, and Moist heat.  PLAN FOR NEXT SESSION: Initiate neutral spine flat back core exercises for HEP.  Watch simulation of rowing on reformer and other Pilates ex    Louanna Vanliew, PT 01/19/2024, 8:07 AM   Delon Norma, PT 01/19/24 8:07 AM Phone: [phone] Fax: [phone]

## 2024-01-19 ENCOUNTER — Encounter: Payer: Self-pay | Admitting: Physical Therapy

## 2024-01-19 ENCOUNTER — Ambulatory Visit (INDEPENDENT_AMBULATORY_CARE_PROVIDER_SITE_OTHER): Admitting: Physical Therapy

## 2024-01-19 DIAGNOSIS — M357 Hypermobility syndrome: Secondary | ICD-10-CM | POA: Diagnosis not present

## 2024-01-19 DIAGNOSIS — M5459 Other low back pain: Secondary | ICD-10-CM | POA: Diagnosis not present

## 2024-01-23 ENCOUNTER — Encounter: Payer: Self-pay | Admitting: Physical Therapy

## 2024-01-23 ENCOUNTER — Ambulatory Visit (INDEPENDENT_AMBULATORY_CARE_PROVIDER_SITE_OTHER): Admitting: Physical Therapy

## 2024-01-23 DIAGNOSIS — M357 Hypermobility syndrome: Secondary | ICD-10-CM

## 2024-01-23 DIAGNOSIS — M5459 Other low back pain: Secondary | ICD-10-CM

## 2024-01-23 NOTE — Therapy (Signed)
 OUTPATIENT PHYSICAL THERAPY NOTE  Patient Name: Diana Proctor MRN: [id number] DOB:May 17, 2000, 23 y.o., female Today's Date: 01/23/2024  END OF SESSION:  PT End of Session - 01/23/24 0854     Visit Number 4    Number of Visits 8    Date for Recertification  02/19/24    Authorization Type BCBS    PT Start Time [phone]    PT Stop Time 0930    PT Time Calculation (min) 38 min    Activity Tolerance Patient tolerated treatment well    Behavior During Therapy [facility] for tasks assessed/performed             Past Medical History:  Diagnosis Date   Headache    Past Surgical History:  Procedure Laterality Date   LAPAROSCOPIC OVARIAN CYSTECTOMY Left 09/04/2020   Procedure: LAPAROSCOPIC OVARIAN CYSTECTOMY;  Surgeon: Barbette Knock, MD;  Location: [facility] OR;  Service: Gynecology;  Laterality: Left;   NO PAST SURGERIES     Patient Active Problem List   Diagnosis Date Noted   Dysautonomia (HCC) 09/15/2020   Fever of unknown origin 02/28/2020   Disequilibrium 05/17/2019   Migraine without aura and without status migrainosus, not intractable 04/25/2019   Episodic tension-type headache, not intractable 04/25/2019   Central positional vertigo 04/25/2019   Orthostatic hypotension 03/02/2016   Closed head injury without loss of consciousness 12/31/2015   Posttraumatic headache 12/31/2015   Post concussion syndrome 12/31/2015   Adjustment disorder with mixed anxiety and depressed mood 01/08/2013   Acne 01/08/2013   Sleep disturbance 01/08/2013    PCP: Rolinda Millman MD   REFERRING PROVIDER: Joane Birmingham MD   REFERRING DIAG: [phone] (ICD-10-CM) - Hypermobile Ehlers-Danlos syndrome M79.10 (ICD-10-CM) - Myalgia  Rationale for Evaluation and Treatment: Rehabilitation  THERAPY DIAG:  Hypermobility syndrome  Other low back pain  ONSET DATE: chronic  SUBJECTIVE:                                                                                                                                                                                            SUBJECTIVE STATEMENT:  Generally sore, had 3 races this past weekend. Rowed in 3- 5K races.  Pt had increased back pain after 10 min of stair stepping.    Patient reports symptoms and/or instability in the following areas:  - Cervical Spine: [] Pain [] Instability [] Limited ROM [] Paresthesia  - Thoracic Spine: [x] Pain [] Instability  - Lumbar Spine: [x] Pain [x] Instability [] Frequent locking/giving out  - Shoulder: [] L [] R  [] Subluxation [] Dislocations [] Pain [] Fatigue  - Elbow: [] L [] R  [] Instability [] Hyperextension [] Pain  - Wrist/Hand: [] L [] R  [] Instability [] Fatigue with use [] Pain  -  Hip: [x] L [x] R  [] Instability [x] Pain [] Clicking [] Gait deviations  - Knee: [x] L [x] R  [x] Instability [] Hyperextension [] Pain  - Ankle/Foot: [] L [] R  [] Instability [x] Frequent sprains Pain   Does the patient have a diagnosis of any of the following: Fatigue[x]  Brain fog[x]  Lightheadedness[x]  Allergies[]  GI[]  Neurodiverse[x] ADD  Additional Notes:  ______________________________________    PERTINENT HISTORY:   Sees Cardiology (Dr. 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     Relevant historical information:    PAIN:  Are you having pain? Yes: NPRS scale: 3/10 Pain location: back pain, central and Rt low back (can alternate)   Pain description: aching  Aggravating factors: standing too long, certain sitting positions, intense workouts  Relieving factors: stretching, changing positions.    Rowing makes her back feel sore, but it goes away  PRECAUTIONS: Other: none , monitor dizziness  RED FLAGS: None   WEIGHT BEARING RESTRICTIONS: No  FALLS:  Has patient fallen in last 6 months? 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   NEXT MD VISIT: sees Dr. Joane Heidelberg.   OBJECTIVE:  Note: Objective measures were completed at Evaluation unless otherwise noted.  DIAGNOSTIC FINDINGS:  MRI normal- results requested   CARDIO/ORTHOSTATICS: NT on eval  Baseline RHR Standing HR Baseline BP Standing BP   PATIENT SURVEYS:  FAS VS LEFS vs MODI NT on eval   COGNITION: Overall cognitive status: Within functional limits for tasks assessed     SENSATION: WFL Notices asymmetry in muscle activation  POSTURE: rounded shoulders and slumped sitting, can correct easily    PALPATION: Hypermobile through lumbar segments L2-L3-L4-L5 Palpation to sacrum produces pain mid lumbar. Hypertonic lumbar extensors   LUMBAR ROM:   AROM eval  Flexion Palms flat  Extension Discomfort central   Right lateral flexion WFL with pain contra  Left lateral flexion WFL with pain contra  Right rotation WFL  Left rotation WFL   (Blank rows = not tested)  LOWER EXTREMITY ROM:     Passive  Right eval Left eval  Hip flexion hyper hyper  Hip extension    Hip abduction    Hip adduction    Hip internal rotation WNL WNL  Hip external rotation WNL WNL   Knee flexion    Knee extension    Ankle dorsiflexion    Ankle plantarflexion    Ankle inversion    Ankle eversion     (Blank rows = not tested)  LOWER EXTREMITY MMT:    L hip weaker in abd   MMT Right eval Left eval  Hip flexion 5 5  Hip extension 5 4+  Hip abduction 5 4+  Hip adduction    Hip internal rotation    Hip external rotation    Knee flexion    Knee extension    Ankle dorsiflexion    Ankle plantarflexion    Ankle inversion    Ankle eversion     (Blank rows = not tested)  LUMBAR SPECIAL TESTS:  Straight leg raise test:  Negative, Trendelenburg sign: Negative, and lumbar extension: loss of neutral spine and rectus abdominus dominates  FUNCTIONAL TESTS:  Apparent instability in lumbar with alternating hip extension                      Pain in lumbar with prone knee flexion More patient able to activate multifidus on right side better than the left  TREATMENT DATE:   [facility] Adult PT Treatment:                                                DATE: 01/23/24 Therapeutic Exercise: Recumbent bike L2 for 5 min  NM RE-ed  Pilates Tower  Extension added alt slow march   Row   Chest press   Chest fly double then single  Pilates Reformer used for LE/core strength, postural strength, lumbopelvic disassociation and core control.   Roll down 1 blue Hinge 2 blue  Goal post pull 1 red  Scooter 1 red press back  Reverse Abdominals UE x 8 blue, then LE x 8 blue   [facility] Adult PT Treatment:                                                DATE: 01/19/24 Neuromuscular re-ed: Pilates Reformer used for LE/core strength, postural strength, lumbopelvic disassociation and core control.  Exercises included: Footwork  Double leg Parallel heels, toes narrow and wide Pilates V heels and toes narrow and wide  2 red 1 blue 1 yellow   Single leg  Heel in parallel and turnout Double leg heel raise and calf stretch, prancing  Bridging 2 red 1 blue x 10  Supine Arm Arcs 1 red 1 yellow  Circles x 8 each direction   Feet in Straps 1 red 1 yellow Arcs parallel and in V  Frog squats x 10  Quadruped 1 red UE x 5 then added LE x 5 each Kneeling plank press out x 10  Kneeling monster plank 1 red spring, done on short box, used forearms, ball , round spine     PATIENT EDUCATION:  Education details: see above  Person educated: Patient and Parent Education method: Explanation, Demonstration, and Handouts Education comprehension: verbalized understanding, returned demonstration, and needs further education   HOME EXERCISE  PROGRAM: Access Code: [email or web address] URL: [email or web address] Date: 01/16/2024 Prepared by: Delon Norma  Exercises - Supine 90/90 with Leg Extensions  - 1 x daily - 7 x weekly - 2 sets - 10 reps - 5 hold - Pilates Bridge  - 1 x daily - 7 x weekly - 2 sets - 10 reps - 5 hold - Bear Plank from Quadruped  - 1 x daily - 7 x weekly - 1 sets - 5 reps - 30 hold - Quadruped Pelvic Floor Contraction with Opposite Arm and Leg Lift  - 1 x daily - 7 x weekly - 2 sets - 10 reps - 5 hold   ASSESSMENT:  CLINICAL IMPRESSION: Pt given resources for rowing ([email or web address]-downloads) .  Used Pilates equipment to build core awareness and proprioception. Pt continues to feel somewhat disconnected to her core but it is improving.      OBJECTIVE IMPAIRMENTS: difficulty walking, decreased strength, increased fascial restrictions, improper body mechanics, pain, and hypermobility.   ACTIVITY LIMITATIONS: carrying, lifting, bending, sitting, standing, squatting, and  locomotion level  PARTICIPATION LIMITATIONS: community activity and occupation  PERSONAL FACTORS: Time since onset of injury/illness/exacerbation and 3+ comorbidities: POTS, chronic pain, genetic condition  are also affecting patient's functional outcome.   REHAB POTENTIAL: Excellent  CLINICAL DECISION MAKING: Stable/uncomplicated  EVALUATION COMPLEXITY: Low   GOALS: Goals reviewed with patient? Yes  SHORT TERM GOALS=LONG TERM GOALS: Target date: 02/19/2024    Patient will be independent with final HEP upon discharge from PT and report consistent benefit following exercise completion.    Baseline:  Goal status: INITIAL  2.  Patient will be able to demonstrate proper posture and lifting techniques related to spine health and reduction of symptoms.   Baseline:  Goal status: INITIAL  3.  Patient will be I with concepts of joint protection and stability as it pertains to joint hypermobility. Baseline:   Goal status: INITIAL  4.  Patient will be able to demonstrate proper deep core activation for basic and intermediate core stabilization exercises. Baseline:  Goal status: INITIAL  5.  Patient will report no pain in her lower back after lifting in the gym Baseline:  Goal status: INITIAL  PLAN:  PT FREQUENCY: 2x/week  PT DURATION: 4 weeks  PLANNED INTERVENTIONS: 97164- PT Re-evaluation, 97750- Physical Performance Testing, 97110-Therapeutic exercises, 97530- Therapeutic activity, [id number]- Neuromuscular re-education, 97535- Self Care, 02859- Manual therapy, Patient/Family education, Taping, Joint mobilization, Spinal mobilization, Cryotherapy, and Moist heat.  PLAN FOR NEXT SESSION: Initiate neutral spine flat back core exercises for HEP.  Watch simulation of rowing on reformer and other Pilates ex    Jadira Nierman, PT 01/23/2024, 11:11 AM   Delon Norma, PT 01/23/24 11:11 AM Phone: [phone] Fax: [phone]

## 2024-01-26 ENCOUNTER — Encounter: Payer: Self-pay | Admitting: Physical Therapy

## 2024-01-26 ENCOUNTER — Ambulatory Visit (INDEPENDENT_AMBULATORY_CARE_PROVIDER_SITE_OTHER): Admitting: Physical Therapy

## 2024-01-26 DIAGNOSIS — M357 Hypermobility syndrome: Secondary | ICD-10-CM | POA: Diagnosis not present

## 2024-01-26 DIAGNOSIS — M5459 Other low back pain: Secondary | ICD-10-CM | POA: Diagnosis not present

## 2024-01-26 NOTE — Therapy (Signed)
 OUTPATIENT PHYSICAL THERAPY NOTE  Patient Name: Diana Proctor MRN: [id number] DOB:2000-08-11, 23 y.o., female Today's Date: 01/26/2024  END OF SESSION:  PT End of Session - 01/26/24 0851     Visit Number 5    Number of Visits 8    Date for Recertification  02/19/24    Authorization Type BCBS    PT Start Time 0848    PT Stop Time 0930    PT Time Calculation (min) 42 min    Activity Tolerance Patient tolerated treatment well    Behavior During Therapy [facility] for tasks assessed/performed             Past Medical History:  Diagnosis Date   Headache    Past Surgical History:  Procedure Laterality Date   LAPAROSCOPIC OVARIAN CYSTECTOMY Left 09/04/2020   Procedure: LAPAROSCOPIC OVARIAN CYSTECTOMY;  Surgeon: Barbette Knock, MD;  Location: [facility] OR;  Service: Gynecology;  Laterality: Left;   NO PAST SURGERIES     Patient Active Problem List   Diagnosis Date Noted   Dysautonomia (HCC) 09/15/2020   Fever of unknown origin 02/28/2020   Disequilibrium 05/17/2019   Migraine without aura and without status migrainosus, not intractable 04/25/2019   Episodic tension-type headache, not intractable 04/25/2019   Central positional vertigo 04/25/2019   Orthostatic hypotension 03/02/2016   Closed head injury without loss of consciousness 12/31/2015   Posttraumatic headache 12/31/2015   Post concussion syndrome 12/31/2015   Adjustment disorder with mixed anxiety and depressed mood 01/08/2013   Acne 01/08/2013   Sleep disturbance 01/08/2013    PCP: Rolinda Millman MD   REFERRING PROVIDER: Joane Birmingham MD   REFERRING DIAG: [phone] (ICD-10-CM) - Hypermobile Ehlers-Danlos syndrome M79.10 (ICD-10-CM) - Myalgia  Rationale for Evaluation and Treatment: Rehabilitation  THERAPY DIAG:  Hypermobility syndrome  Other low back pain  ONSET DATE: chronic  SUBJECTIVE:                                                                                                                                                                                            SUBJECTIVE STATEMENT:  Generally sore, had 3 races this past weekend. Rowed in 3- 5K races.  Pt had increased back pain after 10 min of stair stepping.    Patient reports symptoms and/or instability in the following areas:  - Cervical Spine: [] Pain [] Instability [] Limited ROM [] Paresthesia  - Thoracic Spine: [x] Pain [] Instability  - Lumbar Spine: [x] Pain [x] Instability [] Frequent locking/giving out  - Shoulder: [] L [] R  [] Subluxation [] Dislocations [] Pain [] Fatigue  - Elbow: [] L [] R  [] Instability [] Hyperextension [] Pain  - Wrist/Hand: [] L [] R  [] Instability [] Fatigue with use [] Pain  -  Hip: [x] L [x] R  [] Instability [x] Pain [] Clicking [] Gait deviations  - Knee: [x] L [x] R  [x] Instability [] Hyperextension [] Pain  - Ankle/Foot: [] L [] R  [] Instability [x] Frequent sprains Pain   Does the patient have a diagnosis of any of the following: Fatigue[x]  Brain fog[x]  Lightheadedness[x]  Allergies[]  GI[]  Neurodiverse[x] ADD  Additional Notes:  ______________________________________    PERTINENT HISTORY:   Sees Cardiology (Dr. 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     Relevant historical information:    PAIN:  Are you having pain? Yes: NPRS scale: 3/10 Pain location: back pain, central and Rt low back (can alternate)   Pain description: aching  Aggravating factors: standing too long, certain sitting positions, intense workouts  Relieving factors: stretching, changing positions.    Rowing makes her back feel sore, but it goes away  PRECAUTIONS: Other: none , monitor dizziness  RED FLAGS: None   WEIGHT BEARING RESTRICTIONS: No  FALLS:  Has patient fallen in last 6 months? 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   NEXT MD VISIT: sees Dr. Joane Heidelberg.   OBJECTIVE:  Note: Objective measures were completed at Evaluation unless otherwise noted.  DIAGNOSTIC FINDINGS:  MRI normal- results requested   CARDIO/ORTHOSTATICS: NT on eval  Baseline RHR Standing HR Baseline BP Standing BP   PATIENT SURVEYS:  FAS VS LEFS vs MODI NT on eval   COGNITION: Overall cognitive status: Within functional limits for tasks assessed     SENSATION: WFL Notices asymmetry in muscle activation  POSTURE: rounded shoulders and slumped sitting, can correct easily    PALPATION: Hypermobile through lumbar segments L2-L3-L4-L5 Palpation to sacrum produces pain mid lumbar. Hypertonic lumbar extensors   LUMBAR ROM:   AROM eval  Flexion Palms flat  Extension Discomfort central   Right lateral flexion WFL with pain contra  Left lateral flexion WFL with pain contra  Right rotation WFL  Left rotation WFL   (Blank rows = not tested)  LOWER EXTREMITY ROM:     Passive  Right eval Left eval  Hip flexion hyper hyper  Hip extension    Hip abduction    Hip adduction    Hip internal rotation WNL WNL  Hip external rotation WNL WNL   Knee flexion    Knee extension    Ankle dorsiflexion    Ankle plantarflexion    Ankle inversion    Ankle eversion     (Blank rows = not tested)  LOWER EXTREMITY MMT:    L hip weaker in abd   MMT Right eval Left eval  Hip flexion 5 5  Hip extension 5 4+  Hip abduction 5 4+  Hip adduction    Hip internal rotation    Hip external rotation    Knee flexion    Knee extension    Ankle dorsiflexion    Ankle plantarflexion    Ankle inversion    Ankle eversion     (Blank rows = not tested)  LUMBAR SPECIAL TESTS:  Straight leg raise test:  Negative, Trendelenburg sign: Negative, and lumbar extension: loss of neutral spine and rectus abdominus dominates  FUNCTIONAL TESTS:  Apparent instability in lumbar with alternating hip extension                      Pain in lumbar with prone knee flexion More patient able to activate multifidus on right side Proctor than the left  TREATMENT DATE:   [facility] Adult PT Treatment:                                                DATE: 01/26/24 Therapeutic Exercise: Recumbent bike L2 for 5 min  Bird dog  x 5  Supine single arm reverse fly for anti-rotation  x 10 each  Supine hooklying lat pull over 6 lbs Added knee lift  Manual Therapy: Prone and sidelying palpation and soft tissue mobilization to Rt lumbar paraspinals and quadratus lumborum  Therapeutic Activity: RDL using foam roller  for form RDL 15 lbs KB  B stance 15 lbs KB KB march at chest high slow  X 10 each  Modalities: Cold pack 10 min in hooklying   [facility] Adult PT Treatment:                                                DATE: 01/23/24 Therapeutic Exercise: Recumbent bike L2 for 5 min  NM RE-ed  Pilates Tower  Extension added alt slow march   Row   Chest press   Chest fly double then single  Pilates Reformer used for LE/core strength, postural strength, lumbopelvic disassociation and core control.   Roll down 1 blue Hinge 2 blue  Goal post pull 1 red  Scooter 1 red press back  Reverse Abdominals UE x 8 blue, then LE x 8 blue   [facility] Adult PT Treatment:                                                DATE: 01/19/24 Neuromuscular re-ed: Pilates Reformer used for LE/core strength, postural strength, lumbopelvic disassociation and core control.  Exercises included: Footwork  Double leg Parallel heels, toes narrow and wide Pilates V heels and toes narrow and wide  2 red 1 blue 1 yellow   Single leg  Heel in parallel and turnout Double leg heel raise and calf stretch, prancing  Bridging 2 red 1 blue x 10  Supine Arm  Arcs 1 red 1 yellow  Circles x 8 each direction   Feet in Straps 1 red 1 yellow Arcs parallel and in V  Frog squats x 10  Quadruped 1 red UE x 5 then added LE x 5 each Kneeling plank press out x 10  Kneeling monster plank 1 red spring, done on short box, used forearms, ball , round spine     PATIENT EDUCATION:  Education details: see above  Person educated: Patient and Parent Education method: Explanation, Demonstration, and Handouts Education comprehension: verbalized understanding, returned demonstration, and needs further education   HOME EXERCISE PROGRAM: Access Code: [email or web address] URL: [email or web address] Date: 01/16/2024 Prepared by: Delon Norma  Exercises - Supine 90/90 with Leg Extensions  - 1 x daily - 7 x weekly -  2 sets - 10 reps - 5 hold - Pilates Bridge  - 1 x daily - 7 x weekly - 2 sets - 10 reps - 5 hold - Bear Plank from Quadruped  - 1 x daily - 7 x weekly - 1 sets - 5 reps - 30 hold - Quadruped Pelvic Floor Contraction with Opposite Arm and Leg Lift  - 1 x daily - 7 x weekly - 2 sets - 10 reps - 5 hold   ASSESSMENT:  CLINICAL IMPRESSION: Patient with low back soreness which became more severe with simple hinging exercises. Utilized ice and positioning, stability exercises to reduce pain and it helped somewhat.  Pt with hypermobile lumbar segments, paraspinal hypertrophy contributing to pain.  Added dry needling to her POC as it has helped her in the past.  She will continue to benefit from skilled physical therapy to maintain and improve core stability.   OBJECTIVE IMPAIRMENTS: difficulty walking, decreased strength, increased fascial restrictions, improper body mechanics, pain, and hypermobility.   ACTIVITY LIMITATIONS: carrying, lifting, bending, sitting, standing, squatting, and locomotion level  PARTICIPATION LIMITATIONS: community activity and occupation  PERSONAL FACTORS: Time since onset of injury/illness/exacerbation and 3+  comorbidities: POTS, chronic pain, genetic condition  are also affecting patient's functional outcome.   REHAB POTENTIAL: Excellent  CLINICAL DECISION MAKING: Stable/uncomplicated  EVALUATION COMPLEXITY: Low   GOALS: Goals reviewed with patient? Yes  SHORT TERM GOALS=LONG TERM GOALS: Target date: 02/19/2024    Patient will be independent with final HEP upon discharge from PT and report consistent benefit following exercise completion.    Baseline:  Goal status: INITIAL  2.  Patient will be able to demonstrate proper posture and lifting techniques related to spine health and reduction of symptoms.   Baseline:  Goal status: INITIAL  3.  Patient will be I with concepts of joint protection and stability as it pertains to joint hypermobility. Baseline:  Goal status: INITIAL  4.  Patient will be able to demonstrate proper deep core activation for basic and intermediate core stabilization exercises. Baseline:  Goal status: INITIAL  5.  Patient will report no pain in her lower back after lifting in the gym Baseline:  Goal status: INITIAL  PLAN:  PT FREQUENCY: 2x/week  PT DURATION: 4 weeks  PLANNED INTERVENTIONS: 97164- PT Re-evaluation, 97750- Physical Performance Testing, 97110-Therapeutic exercises, 97530- Therapeutic activity, [id number]- Neuromuscular re-education, 97535- Self Care, [id number]- Manual therapy, Patient/Family education, Taping, Joint mobilization, Spinal mobilization, Cryotherapy, and Moist heat.  PLAN FOR NEXT SESSION: Initiate neutral spine flat back core exercises for HEP.  Watch simulation of rowing on reformer and other Pilates ex    Brenan Modesto, PT 01/26/2024, 8:53 AM   Delon Norma, PT 01/26/24 8:53 AM Phone: [phone] Fax: [phone]

## 2024-01-30 ENCOUNTER — Encounter: Payer: Self-pay | Admitting: Physical Therapy

## 2024-01-30 ENCOUNTER — Ambulatory Visit (INDEPENDENT_AMBULATORY_CARE_PROVIDER_SITE_OTHER): Admitting: Physical Therapy

## 2024-01-30 DIAGNOSIS — M5459 Other low back pain: Secondary | ICD-10-CM

## 2024-01-30 DIAGNOSIS — M357 Hypermobility syndrome: Secondary | ICD-10-CM | POA: Diagnosis not present

## 2024-01-30 NOTE — Therapy (Signed)
 OUTPATIENT PHYSICAL THERAPY NOTE  Patient Name: Diana Proctor MRN: [id number] DOB:March 26, 2000, 23 y.o., female Today's Date: 01/30/2024  END OF SESSION:  PT End of Session - 01/30/24 0849     Visit Number 6    Number of Visits 8    Date for Recertification  02/19/24    Authorization Type BCBS    PT Start Time 0848    PT Stop Time 0933    PT Time Calculation (min) 45 min    Activity Tolerance Patient tolerated treatment well    Behavior During Therapy [facility] for tasks assessed/performed              Past Medical History:  Diagnosis Date   Headache    Past Surgical History:  Procedure Laterality Date   LAPAROSCOPIC OVARIAN CYSTECTOMY Left 09/04/2020   Procedure: LAPAROSCOPIC OVARIAN CYSTECTOMY;  Surgeon: Barbette Knock, MD;  Location: [facility] OR;  Service: Gynecology;  Laterality: Left;   NO PAST SURGERIES     Patient Active Problem List   Diagnosis Date Noted   Dysautonomia (HCC) 09/15/2020   Fever of unknown origin 02/28/2020   Disequilibrium 05/17/2019   Migraine without aura and without status migrainosus, not intractable 04/25/2019   Episodic tension-type headache, not intractable 04/25/2019   Central positional vertigo 04/25/2019   Orthostatic hypotension 03/02/2016   Closed head injury without loss of consciousness 12/31/2015   Posttraumatic headache 12/31/2015   Post concussion syndrome 12/31/2015   Adjustment disorder with mixed anxiety and depressed mood 01/08/2013   Acne 01/08/2013   Sleep disturbance 01/08/2013    PCP: Rolinda Millman MD   REFERRING PROVIDER: Joane Birmingham MD   REFERRING DIAG: [phone] (ICD-10-CM) - Hypermobile Ehlers-Danlos syndrome M79.10 (ICD-10-CM) - Myalgia  Rationale for Evaluation and Treatment: Rehabilitation  THERAPY DIAG:  Hypermobility syndrome  Other low back pain  ONSET DATE: chronic  SUBJECTIVE:                                                                                                                                                                                            SUBJECTIVE STATEMENT:  No pain right now.  Back pain gradually got better over the weekend.    Patient reports symptoms and/or instability in the following areas:  - Cervical Spine: [] Pain [] Instability [] Limited ROM [] Paresthesia  - Thoracic Spine: [x] Pain [] Instability  - Lumbar Spine: [x] Pain [x] Instability [] Frequent locking/giving out  - Shoulder: [] L [] R  [] Subluxation [] Dislocations [] Pain [] Fatigue  - Elbow: [] L [] R  [] Instability [] Hyperextension [] Pain  - Wrist/Hand: [] L [] R  [] Instability [] Fatigue with use [] Pain  - Hip: [x] L [x] R  [] Instability [x] Pain [] Clicking [] Gait deviations  -  Knee: [x] L [x] R  [x] Instability [] Hyperextension [] Pain  - Ankle/Foot: [] L [] R  [] Instability [x] Frequent sprains Pain   Does the patient have a diagnosis of any of the following: Fatigue[x]  Brain fog[x]  Lightheadedness[x]  Allergies[]  GI[]  Neurodiverse[x] ADD  Additional Notes:  ______________________________________    PERTINENT HISTORY:   Sees Cardiology (Dr. 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     Relevant historical information:    PAIN:  Are you having pain? Yes: NPRS scale: 3/10 Pain location: back pain, central and Rt low back (can alternate)   Pain description: aching  Aggravating factors: standing too long, certain sitting positions, intense workouts  Relieving factors: stretching, changing positions.    Rowing makes her back feel sore, but it goes away  PRECAUTIONS: Other: none , monitor dizziness  RED FLAGS: None   WEIGHT BEARING RESTRICTIONS: No  FALLS:  Has patient fallen in last 6 months? 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   NEXT MD VISIT: sees Dr. Joane Heidelberg.   OBJECTIVE:  Note: Objective measures were completed at Evaluation unless otherwise noted.  DIAGNOSTIC FINDINGS:  MRI normal- results requested   CARDIO/ORTHOSTATICS: NT on eval  Baseline RHR Standing HR Baseline BP Standing BP   PATIENT SURVEYS:  FAS VS LEFS vs MODI NT on eval   COGNITION: Overall cognitive status: Within functional limits for tasks assessed     SENSATION: WFL Notices asymmetry in muscle activation  POSTURE: rounded shoulders and slumped sitting, can correct easily    PALPATION: Hypermobile through lumbar segments L2-L3-L4-L5 Palpation to sacrum produces pain mid lumbar. Hypertonic lumbar extensors   LUMBAR ROM:   AROM eval  Flexion Palms flat  Extension Discomfort central   Right lateral flexion WFL with pain contra  Left lateral flexion WFL with pain contra  Right rotation WFL  Left rotation WFL   (Blank rows = not tested)  LOWER EXTREMITY ROM:     Passive  Right eval Left eval  Hip flexion hyper hyper  Hip extension    Hip abduction    Hip adduction    Hip internal rotation WNL WNL  Hip external rotation WNL WNL   Knee flexion    Knee extension    Ankle dorsiflexion    Ankle plantarflexion    Ankle inversion    Ankle eversion     (Blank rows = not tested)  LOWER EXTREMITY MMT:    L hip weaker in abd   MMT Right eval Left eval  Hip flexion 5 5  Hip extension 5 4+  Hip abduction 5 4+  Hip adduction    Hip internal rotation    Hip external rotation    Knee flexion    Knee extension    Ankle dorsiflexion    Ankle plantarflexion    Ankle inversion    Ankle eversion     (Blank rows = not tested)  LUMBAR SPECIAL TESTS:  Straight leg raise test: Negative, Trendelenburg sign: Negative, and lumbar  extension: loss of neutral spine and rectus abdominus dominates    FUNCTIONAL TESTS:  Apparent instability in lumbar with alternating hip extension  Pain in lumbar with prone knee flexion More patient able to activate multifidus on right side better than the left  TREATMENT DATE:    [facility] Adult PT Treatment:                                                DATE: 01/30/24  Therapeutic Exercise:   Recumbent bike L2 for 5 min   Neuromuscular re-ed: Pilates Tower for LE/Core strength, postural strength, lumbopelvic disassociation and core control.  Exercises included: Supine scapular mob Supine core stabilization: 90/90 knee extension 90/90 rotation Ball crunches 2 sets added forearm prop and heel slides  Supine Leg Springs Purple spring single leg arcs and circles  Double leg arcs and squat, circles     [facility] Adult PT Treatment:                                                DATE: 01/26/24 Therapeutic Exercise: Recumbent bike L2 for 5 min  Bird dog  x 5  Supine single arm reverse fly for anti-rotation  x 10 each  Supine hooklying lat pull over 6 lbs Added knee lift  Manual Therapy: Prone and sidelying palpation and soft tissue mobilization to Rt lumbar paraspinals and quadratus lumborum  Therapeutic Activity: RDL using foam roller  for form RDL 15 lbs KB  B stance 15 lbs KB KB march at chest high slow  X 10 each  Modalities: Cold pack 10 min in hooklying   [facility] Adult PT Treatment:                                                DATE: 01/23/24 Therapeutic Exercise: Recumbent bike L2 for 5 min  NM RE-ed  Pilates Tower  Extension added alt slow march   Row   Chest press   Chest fly double then single  Pilates Reformer used for LE/core strength, postural strength, lumbopelvic disassociation and core control.   Roll down 1 blue Hinge 2 blue  Goal post pull 1 red  Scooter 1 red press back  Reverse Abdominals UE x 8 blue, then LE x 8 blue   [facility]  Adult PT Treatment:                                                DATE: 01/19/24 Neuromuscular re-ed: Pilates Reformer used for LE/core strength, postural strength, lumbopelvic disassociation and core control.  Exercises included: Footwork  Double leg Parallel heels, toes narrow and wide Pilates V heels and toes narrow and wide  2 red 1 blue 1 yellow   Single leg  Heel in parallel and turnout Double leg heel raise and calf stretch, prancing  Bridging 2 red 1 blue x 10  Supine Arm Arcs 1 red 1 yellow  Circles x 8 each direction   Feet in Straps 1 red 1 yellow Arcs parallel and in V  Frog squats x 10  Quadruped 1 red UE x 5 then  added LE x 5 each Kneeling plank press out x 10  Kneeling monster plank 1 red spring, done on short box, used forearms, ball , round spine     PATIENT EDUCATION:  Education details: see above  Person educated: Patient and Parent Education method: Explanation, Demonstration, and Handouts Education comprehension: verbalized understanding, returned demonstration, and needs further education   HOME EXERCISE PROGRAM: Access Code: [email or web address] URL: [email or web address] Date: 01/16/2024 Prepared by: Delon Norma  Exercises - Supine 90/90 with Leg Extensions  - 1 x daily - 7 x weekly - 2 sets - 10 reps - 5 hold - Pilates Bridge  - 1 x daily - 7 x weekly - 2 sets - 10 reps - 5 hold - Bear Plank from Quadruped  - 1 x daily - 7 x weekly - 1 sets - 5 reps - 30 hold - Quadruped Pelvic Floor Contraction with Opposite Arm and Leg Lift  - 1 x daily - 7 x weekly - 2 sets - 10 reps - 5 hold   ASSESSMENT:  CLINICAL IMPRESSION:  Patient without back pain today. Utilized soft pilates ball for hypermobility safe abdominal strength.  Pilates Tower was effective for isolating lower body controlled mobility with core stability.  Cont POC.    Patient with low back soreness which became more severe with simple hinging exercises. Utilized ice and positioning,  stability exercises to reduce pain and it helped somewhat.  Pt with hypermobile lumbar segments, paraspinal hypertrophy contributing to pain.  Added dry needling to her POC as it has helped her in the past.  She will continue to benefit from skilled physical therapy to maintain and improve core stability.   OBJECTIVE IMPAIRMENTS: difficulty walking, decreased strength, increased fascial restrictions, improper body mechanics, pain, and hypermobility.   ACTIVITY LIMITATIONS: carrying, lifting, bending, sitting, standing, squatting, and locomotion level  PARTICIPATION LIMITATIONS: community activity and occupation  PERSONAL FACTORS: Time since onset of injury/illness/exacerbation and 3+ comorbidities: POTS, chronic pain, genetic condition  are also affecting patient's functional outcome.   REHAB POTENTIAL: Excellent  CLINICAL DECISION MAKING: Stable/uncomplicated  EVALUATION COMPLEXITY: Low   GOALS: Goals reviewed with patient? Yes  SHORT TERM GOALS=LONG TERM GOALS: Target date: 02/19/2024    Patient will be independent with final HEP upon discharge from PT and report consistent benefit following exercise completion.    Baseline:  Goal status: ongoing   2.  Patient will be able to demonstrate proper posture and lifting techniques related to spine health and reduction of symptoms.   Baseline:  Goal status:ongoing   3.  Patient will be I with concepts of joint protection and stability as it pertains to joint hypermobility. Baseline:  Goal status:ongoing   4.  Patient will be able to demonstrate proper deep core activation for basic and intermediate core stabilization exercises. Baseline:  Goal status: INITIAL  5.  Patient will report no pain in her lower back after lifting in the gym Baseline:  Goal status:ongoing   PLAN:  PT FREQUENCY: 2x/week  PT DURATION: 4 weeks  PLANNED INTERVENTIONS: 97164- PT Re-evaluation, 97750- Physical Performance Testing, 97110-Therapeutic  exercises, 97530- Therapeutic activity, 97112- Neuromuscular re-education, 97535- Self Care, 02859- Manual therapy, Patient/Family education, Taping, Joint mobilization, Spinal mobilization, Cryotherapy, and Moist heat.  PLAN FOR NEXT SESSION:  Check HEP and advance as tolerated .  Stability    Yarexi Pawlicki, PT 01/30/2024, 9:47 AM   Delon Norma, PT 01/30/24 9:47 AM Phone: [phone] Fax: [phone]

## 2024-02-06 ENCOUNTER — Encounter: Payer: Self-pay | Admitting: Physical Therapy

## 2024-02-06 ENCOUNTER — Ambulatory Visit (INDEPENDENT_AMBULATORY_CARE_PROVIDER_SITE_OTHER): Admitting: Physical Therapy

## 2024-02-06 DIAGNOSIS — M357 Hypermobility syndrome: Secondary | ICD-10-CM | POA: Diagnosis not present

## 2024-02-06 DIAGNOSIS — M5459 Other low back pain: Secondary | ICD-10-CM

## 2024-02-06 NOTE — Therapy (Signed)
 OUTPATIENT PHYSICAL THERAPY NOTE  Patient Name: Diana Proctor MRN: [id number] DOB:04-Oct-2000, 23 y.o., female Today's Date: 02/06/2024  END OF SESSION:  PT End of Session - 02/06/24 0852     Visit Number 7    Number of Visits 8    Date for Recertification  02/19/24    Authorization Type BCBS    PT Start Time [id number]    PT Stop Time 0936    PT Time Calculation (min) 45 min    Activity Tolerance Patient tolerated treatment well    Behavior During Therapy [facility] for tasks assessed/performed               Past Medical History:  Diagnosis Date   Headache    Past Surgical History:  Procedure Laterality Date   LAPAROSCOPIC OVARIAN CYSTECTOMY Left 09/04/2020   Procedure: LAPAROSCOPIC OVARIAN CYSTECTOMY;  Surgeon: Barbette Knock, MD;  Location: [facility] OR;  Service: Gynecology;  Laterality: Left;   NO PAST SURGERIES     Patient Active Problem List   Diagnosis Date Noted   Dysautonomia (HCC) 09/15/2020   Fever of unknown origin 02/28/2020   Disequilibrium 05/17/2019   Migraine without aura and without status migrainosus, not intractable 04/25/2019   Episodic tension-type headache, not intractable 04/25/2019   Central positional vertigo 04/25/2019   Orthostatic hypotension 03/02/2016   Closed head injury without loss of consciousness 12/31/2015   Posttraumatic headache 12/31/2015   Post concussion syndrome 12/31/2015   Adjustment disorder with mixed anxiety and depressed mood 01/08/2013   Acne 01/08/2013   Sleep disturbance 01/08/2013    PCP: Rolinda Millman MD   REFERRING PROVIDER: Joane Birmingham MD   REFERRING DIAG: [phone] (ICD-10-CM) - Hypermobile Ehlers-Danlos syndrome M79.10 (ICD-10-CM) - Myalgia  Rationale for Evaluation and Treatment: Rehabilitation  THERAPY DIAG:  Hypermobility syndrome  Other low back pain  ONSET DATE: chronic  SUBJECTIVE:                                                                                                                                                                                            SUBJECTIVE STATEMENT: Back is a little tight. Nothing bad. I felt a weird pain in my L shoulder while erging.  Sharp, down to the elbow  lasted a little while, none right now.             No pain right now.    PERTINENT HISTORY:   Sees Cardiology (Dr. 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     Relevant historical information:    PAIN:  Are you having pain? Yes: NPRS  scale: min /10 Pain location: back pain, central and Rt low back (can alternate)   Pain description: aching  Aggravating factors: standing too long, certain sitting positions, intense workouts  Relieving factors: stretching, changing positions.    Rowing makes her back feel sore, but it goes away  PRECAUTIONS: Other: none , monitor dizziness  RED FLAGS: None   WEIGHT BEARING RESTRICTIONS: No  FALLS:  Has patient fallen in last 6 months? 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   NEXT MD VISIT: sees Dr. Joane Heidelberg.   OBJECTIVE:  Note: Objective measures were completed at Evaluation unless otherwise noted.  DIAGNOSTIC FINDINGS:  MRI normal- results requested    PATIENT SURVEYS:  FAS VS LEFS vs MODI NT on eval   COGNITION: Overall cognitive status: Within functional limits for tasks assessed     SENSATION: WFL Notices asymmetry in muscle activation  POSTURE: rounded shoulders and slumped sitting, can correct easily    PALPATION: Hypermobile through lumbar segments L2-L3-L4-L5 Palpation to sacrum produces pain  mid lumbar. Hypertonic lumbar extensors   LUMBAR ROM:   AROM eval  Flexion Palms flat  Extension Discomfort central   Right lateral flexion WFL with pain contra  Left lateral flexion WFL with pain contra  Right rotation WFL  Left rotation WFL   (Blank rows = not tested)  LOWER EXTREMITY ROM:     Passive  Right eval Left eval  Hip flexion hyper hyper  Hip extension    Hip abduction    Hip adduction    Hip internal rotation WNL WNL  Hip external rotation WNL WNL   Knee flexion    Knee extension    Ankle dorsiflexion    Ankle plantarflexion    Ankle inversion    Ankle eversion     (Blank rows = not tested)  LOWER EXTREMITY MMT:    L hip weaker in abd   MMT Right eval Left eval  Hip flexion 5 5  Hip extension 5 4+  Hip abduction 5 4+  Hip adduction    Hip internal rotation    Hip external rotation    Knee flexion    Knee extension    Ankle dorsiflexion    Ankle plantarflexion    Ankle inversion    Ankle eversion     (Blank rows = not tested)  LUMBAR SPECIAL TESTS:  Straight leg raise test: Negative, Trendelenburg sign: Negative, and lumbar extension: loss of neutral spine and rectus abdominus dominates    FUNCTIONAL TESTS:  Apparent instability in lumbar with alternating hip extension                      Pain in lumbar with prone knee flexion More patient able to activate multifidus on right side better than the left  TREATMENT DATE:   [facility] Adult PT Treatment:                                                DATE: 02/06/24  Therapeutic Exercise: Recumbent bike  Bent over row Kickstand row  Scapular retract/protraction in Q-ped Alt UE lift in Quadruped Lower abdominal stability with ball: march, SLR and clam  Dead bug with compression Lat pull over x 10 lbs x 10 alt LE  Wall push up with plus Horizontal abd Serratus green band   Self Care: Rowing, positions, scapular control/winging.  [facility] Adult PT Treatment:                                                 DATE: 01/30/24  Therapeutic Exercise:   Recumbent bike L2 for 5 min   Neuromuscular re-ed: Pilates Tower for LE/Core strength, postural strength, lumbopelvic disassociation and core control.  Exercises included: Supine scapular mob Supine core stabilization: 90/90 knee extension 90/90 rotation Ball crunches 2 sets added forearm prop and heel slides  Supine Leg Springs Purple spring single leg arcs and circles  Double leg arcs and squat, circles     [facility] Adult PT Treatment:                                                DATE: 01/26/24 Therapeutic Exercise: Recumbent bike L2 for 5 min  Bird dog  x 5  Supine single arm reverse fly for anti-rotation  x 10 each  Supine hooklying lat pull over 6 lbs Added knee lift  Manual Therapy: Prone and sidelying palpation and soft tissue mobilization to Rt lumbar paraspinals and quadratus lumborum  Therapeutic Activity: RDL using foam roller  for form RDL 15 lbs KB  B stance 15 lbs KB KB march at chest high slow  X 10 each  Modalities: Cold pack 10 min in hooklying   [facility] Adult PT Treatment:                                                DATE: 01/23/24 Therapeutic Exercise: Recumbent bike L2 for 5 min  NM RE-ed  Pilates Tower  Extension added alt slow march   Row   Chest press   Chest fly double then single  Pilates Reformer used for LE/core strength, postural strength, lumbopelvic disassociation and core control.   Roll down 1 blue Hinge 2 blue  Goal post pull 1 red  Scooter 1 red press back  Reverse Abdominals UE x 8 blue, then LE x 8 blue   [facility] Adult PT Treatment:                                                DATE: 01/19/24 Neuromuscular re-ed: Pilates Reformer used for LE/core strength, postural strength, lumbopelvic disassociation and core control.  Exercises included: Footwork  Double leg Parallel heels, toes narrow and wide Pilates V heels and toes narrow and wide  2 red 1 blue 1 yellow   Single leg   Heel in parallel and turnout Double leg heel raise and calf stretch, prancing  Bridging 2 red 1 blue x 10  Supine  Arm Arcs 1 red 1 yellow  Circles x 8 each direction   Feet in Straps 1 red 1 yellow Arcs parallel and in V  Frog squats x 10  Quadruped 1 red UE x 5 then added LE x 5 each Kneeling plank press out x 10  Kneeling monster plank 1 red spring, done on short box, used forearms, ball , round spine     PATIENT EDUCATION:  Education details: see above  Person educated: Patient and Parent Education method: Explanation, Demonstration, and Handouts Education comprehension: verbalized understanding, returned demonstration, and needs further education   HOME EXERCISE PROGRAM: Access Code: [email or web address] URL: [email or web address] Date: 01/16/2024 Prepared by: Delon Norma  Exercises - Supine 90/90 with Leg Extensions  - 1 x daily - 7 x weekly - 2 sets - 10 reps - 5 hold - Pilates Bridge  - 1 x daily - 7 x weekly - 2 sets - 10 reps - 5 hold - Bear Plank from Quadruped  - 1 x daily - 7 x weekly - 1 sets - 5 reps - 30 hold - Quadruped Pelvic Floor Contraction with Opposite Arm and Leg Lift  - 1 x daily - 7 x weekly - 2 sets - 10 reps - 5 hold Kick stand row   ASSESSMENT:  CLINICAL IMPRESSION: Patient with min low back soreness today.  Describes L shoulder issues when rowing recently.  It sounds like she may have subluxed her shoulder and was left with nerve pain afterwards.  It was resolved today. Noted L scapular winging today, given exercises to address this. She will get dry needling next visit. She will continue to benefit from skilled physical therapy to maintain and improve core stability.   OBJECTIVE IMPAIRMENTS: difficulty walking, decreased strength, increased fascial restrictions, improper body mechanics, pain, and hypermobility.   ACTIVITY LIMITATIONS: carrying, lifting, bending, sitting, standing, squatting, and locomotion level  PARTICIPATION  LIMITATIONS: community activity and occupation  PERSONAL FACTORS: Time since onset of injury/illness/exacerbation and 3+ comorbidities: POTS, chronic pain, genetic condition  are also affecting patient's functional outcome.   REHAB POTENTIAL: Excellent  CLINICAL DECISION MAKING: Stable/uncomplicated  EVALUATION COMPLEXITY: Low   GOALS: Goals reviewed with patient? Yes  SHORT TERM GOALS=LONG TERM GOALS: Target date: 02/19/2024    Patient will be independent with final HEP upon discharge from PT and report consistent benefit following exercise completion.    Baseline:  Goal status: ongoing   2.  Patient will be able to demonstrate proper posture and lifting techniques related to spine health and reduction of symptoms.   Baseline:  Goal status:ongoing   3.  Patient will be I with concepts of joint protection and stability as it pertains to joint hypermobility. Baseline:  Goal status:ongoing   4.  Patient will be able to demonstrate proper deep core activation for basic and intermediate core stabilization exercises. Baseline:  Goal status: INITIAL  5.  Patient will report no pain in her lower back after lifting in the gym Baseline:  Goal status:ongoing   PLAN:  PT FREQUENCY: 2x/week  PT DURATION: 4 weeks  PLANNED INTERVENTIONS: 97164- PT Re-evaluation, 97750- Physical Performance Testing, 97110-Therapeutic exercises, 97530- Therapeutic activity, 97112- Neuromuscular re-education, 97535- Self Care, [id number]- Manual therapy, Patient/Family education, Taping, Joint mobilization, Spinal mobilization, Cryotherapy, and Moist heat.  PLAN FOR NEXT SESSION: DN lumbar paraspinals , scap winging  Check HEP and advance as tolerated .  Stability    Alsie Younes, PT 02/06/2024, 11:56 AM   Delon Norma, PT 02/06/24 11:56 AM Phone:  [phone] Fax: [phone]

## 2024-02-12 ENCOUNTER — Ambulatory Visit: Admitting: Family Medicine

## 2024-02-12 VITALS — BP 100/72 | HR 74 | Ht 68.0 in | Wt 148.0 lb

## 2024-02-12 DIAGNOSIS — R109 Unspecified abdominal pain: Secondary | ICD-10-CM

## 2024-02-12 DIAGNOSIS — Q7962 Hypermobile Ehlers-Danlos syndrome: Secondary | ICD-10-CM | POA: Diagnosis not present

## 2024-02-12 DIAGNOSIS — D8940 Mast cell activation, unspecified: Secondary | ICD-10-CM | POA: Diagnosis not present

## 2024-02-12 DIAGNOSIS — I951 Orthostatic hypotension: Secondary | ICD-10-CM

## 2024-02-12 NOTE — Progress Notes (Signed)
   LILLETTE Ileana Collet, PhD, LAT, ATC acting as a scribe for Artist Lloyd, MD.  Diana Proctor is a 23 y.o. female who presents to [facility] today for 59-month f/u hEDS, dysautonomia, and MCAS. Pt was last seen by Dr. Lloyd on 01/10/24 and was advised to d/c spironolactone and try taking famotidine and if ineffective try montelukast. Also advised on fluid/salt intake and exercise goals. Pt's completing 7 PT visits.  Today, pt reports she has been slowing weaning off the spironolactone, has 2 days remaining. She has been trying to take the famotidine, but often forgets the 2nd dose.  She states she at her baseline. Her low back pain continues.   She notes continued lightheadedness and dizziness and musculoskeletal pain including back pain.  Additionally she notes some abdominal discomfort and feels very hungry in the evening. She remains physically active participating in rowing.  Pertinent review of systems: No fevers or chills  Relevant historical information: Dysautonomia.   Exam:  BP 100/72   Pulse 74   Ht 5' 8 (1.727 m)   Wt 148 lb (67.1 kg)   SpO2 98%   BMI 22.50 kg/m  General: Well Developed, well nourished, and in no acute distress.   MSK: L-spine normal lumbar motion.  Lower extremity strength is intact.    Lab and Radiology Results No results found for this or any previous visit (from the past 72 hours). No results found.     Assessment and Plan: 23 y.o. female with hypermobile EDS improving with PT but still symptomatic.  We talked about POTS goals.  She is weaning off of spironolactone and maintaining salt and fluid and exercise.  She is experiencing some low back pain.  She did have a MRI earlier this year which did not show severe abnormalities.  She has been working with physical therapy.  Reviewed some static core stabilization exercises.  Recheck in 2 months.  PDMP not reviewed this encounter. No orders of the defined  types were placed in this encounter.  No orders of the defined types were placed in this encounter.    Discussed warning signs or symptoms. Please see discharge instructions. Patient expresses understanding.   The above documentation has been reviewed and is accurate and complete Artist Lloyd, M.D.

## 2024-02-12 NOTE — Patient Instructions (Addendum)
 Thank you for coming in today.   I can add propranolol for hear rate with a phone call.   Consider adding some extra protein after dinner.   Check back in 2 months

## 2024-02-13 ENCOUNTER — Ambulatory Visit: Admitting: Physical Therapy

## 2024-02-13 ENCOUNTER — Encounter: Payer: Self-pay | Admitting: Physical Therapy

## 2024-02-13 DIAGNOSIS — M357 Hypermobility syndrome: Secondary | ICD-10-CM | POA: Diagnosis not present

## 2024-02-13 DIAGNOSIS — M5459 Other low back pain: Secondary | ICD-10-CM | POA: Diagnosis not present

## 2024-02-13 NOTE — Patient Instructions (Signed)

## 2024-02-13 NOTE — Therapy (Signed)
 OUTPATIENT PHYSICAL THERAPY NOTE  Patient Name: Diana Proctor MRN: [id number] DOB:January 16, 2001, 23 y.o., female Today's Date: 02/13/2024  END OF SESSION:  PT End of Session - 02/13/24 1922     Visit Number 8    Number of Visits 8    Date for Recertification  02/19/24    Authorization Type BCBS    PT Start Time [phone]    PT Stop Time 0932    PT Time Calculation (min) 39 min    Activity Tolerance Patient tolerated treatment well    Behavior During Therapy [facility] for tasks assessed/performed                Past Medical History:  Diagnosis Date   Headache    Past Surgical History:  Procedure Laterality Date   LAPAROSCOPIC OVARIAN CYSTECTOMY Left 09/04/2020   Procedure: LAPAROSCOPIC OVARIAN CYSTECTOMY;  Surgeon: Barbette Knock, MD;  Location: [facility] OR;  Service: Gynecology;  Laterality: Left;   NO PAST SURGERIES     Patient Active Problem List   Diagnosis Date Noted   Hypermobile Ehlers-Danlos syndrome 02/12/2024   Dysautonomia (HCC) 09/15/2020   Fever of unknown origin 02/28/2020   Disequilibrium 05/17/2019   Migraine without aura and without status migrainosus, not intractable 04/25/2019   Episodic tension-type headache, not intractable 04/25/2019   Central positional vertigo 04/25/2019   Orthostatic hypotension 03/02/2016   Closed head injury without loss of consciousness 12/31/2015   Posttraumatic headache 12/31/2015   Post concussion syndrome 12/31/2015   Adjustment disorder with mixed anxiety and depressed mood 01/08/2013   Acne 01/08/2013   Sleep disturbance 01/08/2013    PCP: Rolinda Millman MD   REFERRING PROVIDER: Joane Birmingham MD   REFERRING DIAG: [phone] (ICD-10-CM) - Hypermobile Ehlers-Danlos syndrome M79.10 (ICD-10-CM) - Myalgia  Rationale for Evaluation and Treatment: Rehabilitation  THERAPY DIAG:  Hypermobility syndrome  Other low back pain  ONSET DATE: chronic  SUBJECTIVE:                                                                                                                                                                                            SUBJECTIVE STATEMENT: Pt states ongoing soreness in back. Is doing better with no pain during exercise. She had f/u with Sports Med last week, will return in another couple months.    PERTINENT HISTORY:   Sees Cardiology (Dr. 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     Relevant historical information:    PAIN:  Are you having pain? Yes: NPRS scale: min /10 Pain location: back pain, central and Rt low back (can  alternate)   Pain description: aching  Aggravating factors: standing too long, certain sitting positions, intense workouts  Relieving factors: stretching, changing positions.    Rowing makes her back feel sore, but it goes away  PRECAUTIONS: Other: none , monitor dizziness  RED FLAGS: None   WEIGHT BEARING RESTRICTIONS: No  FALLS:  Has patient fallen in last 6 months? 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   NEXT MD VISIT: sees Dr. Joane Heidelberg.   OBJECTIVE:  Note: Objective measures were completed at Evaluation unless otherwise noted.  DIAGNOSTIC FINDINGS:  MRI normal- results requested    PATIENT SURVEYS:  FAS VS LEFS vs MODI NT on eval   COGNITION: Overall cognitive status: Within functional limits for tasks assessed     SENSATION: WFL Notices asymmetry in muscle activation  POSTURE: rounded shoulders and slumped sitting, can correct easily    PALPATION: Hypermobile through lumbar segments L2-L3-L4-L5 Palpation to  sacrum produces pain mid lumbar. Hypertonic lumbar extensors   LUMBAR ROM:   AROM eval  Flexion Palms flat  Extension Discomfort central   Right lateral flexion WFL with pain contra  Left lateral flexion WFL with pain contra  Right rotation WFL  Left rotation WFL   (Blank rows = not tested)  LOWER EXTREMITY ROM:     Passive  Right eval Left eval  Hip flexion hyper hyper  Hip extension    Hip abduction    Hip adduction    Hip internal rotation WNL WNL  Hip external rotation WNL WNL   Knee flexion    Knee extension    Ankle dorsiflexion    Ankle plantarflexion    Ankle inversion    Ankle eversion     (Blank rows = not tested)  LOWER EXTREMITY MMT:    L hip weaker in abd   MMT Right eval Left eval  Hip flexion 5 5  Hip extension 5 4+  Hip abduction 5 4+  Hip adduction    Hip internal rotation    Hip external rotation    Knee flexion    Knee extension    Ankle dorsiflexion    Ankle plantarflexion    Ankle inversion    Ankle eversion     (Blank rows = not tested)  LUMBAR SPECIAL TESTS:  Straight leg raise test: Negative, Trendelenburg sign: Negative, and lumbar extension: loss of neutral spine and rectus abdominus dominates    FUNCTIONAL TESTS:  Apparent instability in lumbar with alternating hip extension                      Pain in lumbar with prone knee flexion More patient able to activate multifidus on right side better than the left   TREATMENT DATE:   [facility] Adult PT Treatment:                                             DATE: 02/13/24 Therapeutic Exercise: [place] pose Center, Left, Right Scapular retract/protraction in Q-ped  Alt UE lift  in Quadruped x 12,  LE lifts  x12;  Bird dog x 10; cuing for core control Horizontal abd GTB x 15-cueing for trunk position and core control Seated-education and practice for hip hinge with neutral spine for flex and ext (row motion) Manual: DTM to R QL in S/L Trigger Point Dry Needling  Initial Treatment:  Pt instructed on Dry Needling rational, procedures, and possible side effects. Pt instructed to expect mild to moderate muscle soreness later in the day and/or into the next day.  Pt instructed in methods to reduce muscle soreness. Pt instructed to continue prescribed HEP. Because Dry Needling was performed over or adjacent to a lung field, pt was educated on S/S of pneumothorax and to seek immediate medical attention should they occur.  Patient was educated on signs and symptoms of infection and other risk factors and advised to seek medical attention should they occur.  Patient verbalized understanding of these instructions and education.   Patient Verbal Consent Given: Yes Education Handout Provided: Yes Muscles Treated: Bil lumbar multifidi L2-L4 Electrical Stimulation Performed: No Treatment Response/Outcome: Palpable increase in muscle length.        [facility] Adult PT Treatment:                                                DATE: 02/06/24 Therapeutic Exercise: Recumbent bike  Bent over row Kickstand row  Scapular retract/protraction in Q-ped Alt UE lift in Quadruped Lower abdominal stability with ball: 05-28-23, SLR and clam  Dead bug with compression Lat pull over x 10 lbs x 10 alt LE  Wall push up with plus Horizontal abd Serratus green band   Self Care: Rowing, positions, scapular control/winging.  [facility] Adult PT Treatment:                                                DATE: 01/30/24  Therapeutic Exercise:   Recumbent bike L2 for 5 min   Neuromuscular re-ed: Pilates Tower for LE/Core strength, postural strength, lumbopelvic disassociation and core control.  Exercises included: Supine scapular mob Supine core stabilization: 90/90 knee extension 90/90 rotation Ball crunches 2 sets added forearm prop and heel slides  Supine Leg Springs Purple spring single leg arcs and circles  Double leg arcs and squat, circles     [facility] Adult PT Treatment:                                                 DATE: 01/26/24 Therapeutic Exercise: Recumbent bike L2 for 5 min  Bird dog  x 5  Supine single arm reverse fly for anti-rotation  x 10 each  Supine hooklying lat pull over 6 lbs Added knee lift  Manual Therapy: Prone and sidelying palpation and soft tissue mobilization to Rt lumbar paraspinals and quadratus lumborum  Therapeutic Activity: RDL using foam roller  for form RDL 15 lbs KB  B stance 15 lbs KB KB march at chest high slow  X 10 each  Modalities: Cold pack 10 min in hooklying   [facility] Adult PT Treatment:  DATE: 01/23/24 Therapeutic Exercise: Recumbent bike L2 for 5 min  NM RE-ed  Pilates Tower  Extension added alt slow march   Row   Chest press   Chest fly double then single  Pilates Reformer used for LE/core strength, postural strength, lumbopelvic disassociation and core control.   Roll down 1 blue Hinge 2 blue  Goal post pull 1 red  Scooter 1 red press back  Reverse Abdominals UE x 8 blue, then LE x 8 blue   [facility] Adult PT Treatment:                                                DATE: 01/19/24 Neuromuscular re-ed: Pilates Reformer used for LE/core strength, postural strength, lumbopelvic disassociation and core control.  Exercises included: Footwork  Double leg Parallel heels, toes narrow and wide Pilates V heels and toes narrow and wide  2 red 1 blue 1 yellow   Single leg  Heel in parallel and turnout Double leg heel raise and calf stretch, prancing  Bridging 2 red 1 blue x 10  Supine Arm Arcs 1 red 1 yellow  Circles x 8 each direction   Feet in Straps 1 red 1 yellow Arcs parallel and in V  Frog squats x 10  Quadruped 1 red UE x 5 then added LE x 5 each Kneeling plank press out x 10  Kneeling monster plank 1 red spring, done on short box, used forearms, ball , round spine     PATIENT EDUCATION:  Education details: see above  Person educated: Patient and Parent Education method:  Explanation, Demonstration, and Handouts Education comprehension: verbalized understanding, returned demonstration, and needs further education   HOME EXERCISE PROGRAM: Access Code: [email or web address] URL: [email or web address] Date: 01/16/2024 Prepared by: Delon Norma  Exercises - Supine 90/90 with Leg Extensions  - 1 x daily - 7 x weekly - 2 sets - 10 reps - 5 hold - Pilates Bridge  - 1 x daily - 7 x weekly - 2 sets - 10 reps - 5 hold - Bear Plank from Quadruped  - 1 x daily - 7 x weekly - 1 sets - 5 reps - 30 hold - Quadruped Pelvic Floor Contraction with Opposite Arm and Leg Lift  - 1 x daily - 7 x weekly - 2 sets - 10 reps - 5 hold Kick stand row   ASSESSMENT:  CLINICAL IMPRESSION: Pt seems to have improving symptoms overall. She does have tightness in paraspinals and in R QL. Dry needling for lumbar spine today, pt with good tolerance. May benefit from future sessions for back and/or QL. She requires cueing for TA contraction and control with UE and LE movement today. Will benefit from continued care.   OBJECTIVE IMPAIRMENTS: difficulty walking, decreased strength, increased fascial restrictions, improper body mechanics, pain, and hypermobility.   ACTIVITY LIMITATIONS: carrying, lifting, bending, sitting, standing, squatting, and locomotion level  PARTICIPATION LIMITATIONS: community activity and occupation  PERSONAL FACTORS: Time since onset of injury/illness/exacerbation and 3+ comorbidities: POTS, chronic pain, genetic condition  are also affecting patient's functional outcome.   REHAB POTENTIAL: Excellent  CLINICAL DECISION MAKING: Stable/uncomplicated  EVALUATION COMPLEXITY: Low   GOALS: Goals reviewed with patient? Yes  SHORT TERM GOALS=LONG TERM GOALS: Target date: 02/19/2024    Patient will be independent with final HEP upon discharge from PT and report consistent benefit following exercise  completion.    Baseline:  Goal status: ongoing   2.  Patient  will be able to demonstrate proper posture and lifting techniques related to spine health and reduction of symptoms.   Baseline:  Goal status:ongoing   3.  Patient will be I with concepts of joint protection and stability as it pertains to joint hypermobility. Baseline:  Goal status:ongoing   4.  Patient will be able to demonstrate proper deep core activation for basic and intermediate core stabilization exercises. Baseline:  Goal status: INITIAL  5.  Patient will report no pain in her lower back after lifting in the gym Baseline:  Goal status:ongoing   PLAN:  PT FREQUENCY: 2x/week  PT DURATION: 4 weeks  PLANNED INTERVENTIONS: [id number]- PT Re-evaluation, 97750- Physical Performance Testing, 97110-Therapeutic exercises, 97530- Therapeutic activity, 97112- Neuromuscular re-education, 97535- Self Care, [id number]- Manual therapy, Patient/Family education, Taping, Joint mobilization, Spinal mobilization, Cryotherapy, and Moist heat.  PLAN FOR NEXT SESSION: DN lumbar paraspinals , scap winging  Check HEP and advance as tolerated .  Stability    Tinnie Don, PT, DPT 8:16 PM  02/13/24

## 2024-02-16 ENCOUNTER — Ambulatory Visit: Admitting: Physical Therapy

## 2024-02-16 ENCOUNTER — Encounter: Payer: Self-pay | Admitting: Physical Therapy

## 2024-02-16 DIAGNOSIS — M357 Hypermobility syndrome: Secondary | ICD-10-CM

## 2024-02-16 DIAGNOSIS — M5459 Other low back pain: Secondary | ICD-10-CM | POA: Diagnosis not present

## 2024-02-16 NOTE — Therapy (Signed)
 OUTPATIENT PHYSICAL THERAPY NOTE Renewal  Patient Name: Diana Proctor MRN: [id number] DOB:[birth year], 23 y.o., female Today's Date: 02/16/2024  END OF SESSION:  PT End of Session - 02/16/24 0938     Visit Number 9    Number of Visits 21    Date for Recertification  03/29/24    Authorization Type BCBS    PT Start Time 0935    PT Stop Time 1025    PT Time Calculation (min) 50 min    Activity Tolerance Patient tolerated treatment well    Behavior During Therapy [facility] for tasks assessed/performed                 Past Medical History:  Diagnosis Date   Headache    Past Surgical History:  Procedure Laterality Date   LAPAROSCOPIC OVARIAN CYSTECTOMY Left 09/04/2020   Procedure: LAPAROSCOPIC OVARIAN CYSTECTOMY;  Surgeon: Barbette Knock, MD;  Location: [facility] OR;  Service: Gynecology;  Laterality: Left;   NO PAST SURGERIES     Patient Active Problem List   Diagnosis Date Noted   Hypermobile Ehlers-Danlos syndrome 02/12/2024   Dysautonomia (HCC) 09/15/2020   Fever of unknown origin 02/28/2020   Disequilibrium 05/17/2019   Migraine without aura and without status migrainosus, not intractable 04/25/2019   Episodic tension-type headache, not intractable 04/25/2019   Central positional vertigo 04/25/2019   Orthostatic hypotension 03/02/2016   Closed head injury without loss of consciousness 12/31/2015   Posttraumatic headache 12/31/2015   Post concussion syndrome 12/31/2015   Adjustment disorder with mixed anxiety and depressed mood 01/08/2013   Acne 01/08/2013   Sleep disturbance 01/08/2013    PCP: Rolinda Millman MD   REFERRING PROVIDER: Joane Birmingham MD   REFERRING DIAG: [phone] (ICD-10-CM) - Hypermobile Ehlers-Danlos syndrome M79.10 (ICD-10-CM) - Myalgia  Rationale for Evaluation and Treatment: Rehabilitation  THERAPY DIAG:  Hypermobility syndrome  Other low back pain  ONSET DATE: chronic  SUBJECTIVE:                                                                                                                                                                                            SUBJECTIVE STATEMENT: Patient had good result from Dry needling.  Back feels better.  She feels her pain is more intermittent.   Standing to long, sitting too long.  She has not had pain with her workouts (like before) with RDL and squat, back extension (avoids)    PERTINENT HISTORY:   Sees Cardiology (Dr. 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     Relevant historical information:    PAIN:  Are you having pain? Yes: NPRS scale: no /10 Pain location: back pain, central and Rt low back (can alternate)   Pain description: aching  Aggravating factors: standing too long, certain sitting positions, intense workouts  Relieving factors: stretching, changing positions.    Rowing makes her back feel sore, but it goes away  PRECAUTIONS: Other: none , monitor dizziness  RED FLAGS: None   WEIGHT BEARING RESTRICTIONS: No  FALLS:  Has patient fallen in last 6 months? 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   NEXT MD VISIT: sees Dr. Joane Heidelberg.   OBJECTIVE:  Note: Objective measures were completed at Evaluation unless otherwise noted.  DIAGNOSTIC FINDINGS:  MRI normal- results requested    PATIENT SURVEYS:  FAS VS LEFS vs MODI NT on eval   COGNITION: Overall cognitive status: Within functional limits for tasks assessed     SENSATION: WFL Notices asymmetry in muscle activation  POSTURE: rounded shoulders and slumped sitting, can  correct easily    PALPATION: Hypermobile through lumbar segments L2-L3-L4-L5 Palpation to sacrum produces pain mid lumbar. Hypertonic lumbar extensors   LUMBAR ROM:   AROM eval 02/16/24  Flexion Palms flat Palms flat   Extension Discomfort central  Discomfort central   Right lateral flexion WFL with pain contra WFL- contra pain   Left lateral flexion WFL with pain contra WFL -contra pain   Right rotation [facility] WFL  Left rotation WFL WFL    (Blank rows = not tested)  LOWER EXTREMITY ROM:     Passive  Right eval Left eval  Hip flexion hyper hyper  Hip extension    Hip abduction    Hip adduction    Hip internal rotation WNL WNL  Hip external rotation WNL WNL   Knee flexion    Knee extension    Ankle dorsiflexion    Ankle plantarflexion    Ankle inversion    Ankle eversion     (Blank rows = not tested)  LOWER EXTREMITY MMT:    L hip weaker in abd   MMT Right eval Left eval 02/16/24  Hip flexion 5 5 5   Hip extension 5 4+ 5  Hip abduction 5 4+ 5  Hip adduction     Hip internal rotation     Hip external rotation     Knee flexion     Knee extension     Ankle dorsiflexion     Ankle plantarflexion     Ankle inversion     Ankle eversion      (Blank rows = not tested)  UE MMT  5/5 bilateral  L scapular winging   LUMBAR SPECIAL TESTS:  Straight leg raise test: Negative, Trendelenburg sign: Negative, and lumbar extension: loss of neutral spine and rectus abdominus dominates    FUNCTIONAL TESTS:  Apparent instability in lumbar with alternating hip extension                      Pain in lumbar with prone knee flexion More patient able to activate multifidus on right side better than the left   TREATMENT DATE:   [facility] Adult PT Treatment:  DATE: 02/16/24 Therapeutic Exercise: Recumbent bike Supine 90/90- ball under  Reverse toe taps Lat pull over 8 lbs pinch on L side - mod to iso hold with march   NM  re-education Quadruped with ball bear plank Multifidus in prone Foam roller Arm arcs March  Clam  Palpation multifidus   [facility] Adult PT Treatment:                                             DATE: 02/13/24 Therapeutic Exercise: Karolynn pose Center, Left, Right Scapular retract/protraction in Q-ped  Alt UE lift in Quadruped x 12,  LE lifts  x12;  Bird dog x 10; cuing for core control Horizontal abd GTB x 15-cueing for trunk position and core control Seated-education and practice for hip hinge with neutral spine for flex and ext (row motion) Manual: DTM to R QL in S/L Trigger Point Dry Needling  Initial Treatment: Pt instructed on Dry Needling rational, procedures, and possible side effects. Pt instructed to expect mild to moderate muscle soreness later in the day and/or into the next day.  Pt instructed in methods to reduce muscle soreness. Pt instructed to continue prescribed HEP. Because Dry Needling was performed over or adjacent to a lung field, pt was educated on S/S of pneumothorax and to seek immediate medical attention should they occur.  Patient was educated on signs and symptoms of infection and other risk factors and advised to seek medical attention should they occur.  Patient verbalized understanding of these instructions and education.   Patient Verbal Consent Given: Yes Education Handout Provided: Yes Muscles Treated: Bil lumbar multifidi L2-L4 Electrical Stimulation Performed: No Treatment Response/Outcome: Palpable increase in muscle length.    PATIENT EDUCATION:  Education details: see above  Person educated: Patient and Parent Education method: Explanation, Demonstration, and Handouts Education comprehension: verbalized understanding, returned demonstration, and needs further education   HOME EXERCISE PROGRAM: Access Code: [email or web address] URL: [email or web address] Date: 01/16/2024 Prepared by: Delon Norma Access Code: [email or web address] URL:  [email or web address] Date: 02/16/2024 Prepared by: Delon Norma  Exercises - Supine 90/90 with Leg Extensions  - 1 x daily - 7 x weekly - 2 sets - 10 reps - 5 hold - Pilates Bridge  - 1 x daily - 7 x weekly - 2 sets - 10 reps - 5 hold - Quadruped Pelvic Floor Contraction with Opposite Arm and Leg Lift  - 1 x daily - 7 x weekly - 2 sets - 10 reps - 5 hold - Dead Bug with Swiss Ball  - 1 x daily - 7 x weekly - 2 sets - 10 reps - 5 hold - Bent Over Single Arm Shoulder Row with Dumbbell  - 1 x daily - 7 x weekly - 2 sets - 10 reps - 5 hold - Scapular Protraction at Wall  - 1 x daily - 7 x weekly - 2 sets - 10 reps - 5 hold - Shoulder Flexion Serratus Activation with Resistance  - 1 x daily - 7 x weekly - 2 sets - 10 reps - 5 hold - Supine Multifidus with Heel Press - Leg Bent  - 1 x daily - 7 x weekly - 1 sets - 10 reps - 5 hold - Multifidus Knee Lift Off With Transverse Abdominis and Pelvic Floor Contraction  - 1 x daily - 7 x weekly - 1 sets - 3-5 reps -  30 hold  ASSESSMENT:  CLINICAL IMPRESSION: Remas was renewed for more visits today.  She reports less pain overall in her back, intensity and frequency.  She has weakness in her lumbar multifidi on the L side. She also has scapular winging on ht eL side and feels a pinch with overhead lifting.  Lumbar stabilization will be the focus for the next several weeks.  Patient will continue to benefit from skilled PT in order to return to PLOF and optimize functional mobility.      Pt seems to have improving symptoms overall. She does have tightness in paraspinals and in R QL. Dry needling for lumbar spine today, pt with good tolerance. May benefit from future sessions for back and/or QL. She requires cueing for TA contraction and control with UE and LE movement today. Will benefit from continued care.   OBJECTIVE IMPAIRMENTS: difficulty walking, decreased strength, increased fascial restrictions, improper body mechanics, pain, and  hypermobility.   ACTIVITY LIMITATIONS: carrying, lifting, bending, sitting, standing, squatting, and locomotion level  PARTICIPATION LIMITATIONS: community activity and occupation  PERSONAL FACTORS: Time since onset of injury/illness/exacerbation and 3+ comorbidities: POTS, chronic pain, genetic condition  are also affecting patient's functional outcome.   REHAB POTENTIAL: Excellent  CLINICAL DECISION MAKING: Stable/uncomplicated  EVALUATION COMPLEXITY: Low   GOALS: Goals reviewed with patient? Yes  SHORT TERM GOALS=LONG TERM GOALS: Target date: 02/19/2024    Patient will be independent with final HEP upon discharge from PT and report consistent benefit following exercise completion.    Baseline:  Goal status: ongoing   2.  Patient will be able to demonstrate proper posture and lifting techniques related to spine health and reduction of symptoms.   Baseline:  Goal status:ongoing   3.  Patient will be I with concepts of joint protection and stability as it pertains to joint hypermobility. Baseline:  Goal status:ongoing   4.  Patient will be able to demonstrate proper deep core activation for basic and intermediate core stabilization exercises. Baseline:  Goal status: ongoing   5.  Patient will report no pain in her lower back after lifting in the gym Baseline: sore after, uses barbell  Goal status:ongoing   6.  Patient will be able to stand for 30 min without increased pain in low back (cooking, home tasks)   Baseline:  Goal status: NEW   7. Patient will be able to improve her L scapular winging with weightbearing shoulder/core exercise.   Baseline:  Goal status: NEW   PLAN:  PT FREQUENCY: 2x/week 1-2 x   PT DURATION: 4 weeks 4-6   PLANNED INTERVENTIONS: 02835- PT Re-evaluation, 97750- Physical Performance Testing, 97110-Therapeutic exercises, 97530- Therapeutic activity, [id number]- Neuromuscular re-education, 97535- Self Care, 02859- Manual therapy, Patient/Family  education, Taping, Joint mobilization, Spinal mobilization, Cryotherapy, and Moist heat.  PLAN FOR NEXT SESSION: multifidus, TA training. DN lumbar paraspinals , scap winging , foam roller stab.  Check HEP and advance as tolerated.   Delon Norma, PT 02/16/24 10:37 AM Phone: [phone] Fax: [phone]

## 2024-02-21 ENCOUNTER — Encounter: Payer: Self-pay | Admitting: Physical Therapy

## 2024-02-21 ENCOUNTER — Ambulatory Visit (INDEPENDENT_AMBULATORY_CARE_PROVIDER_SITE_OTHER): Admitting: Physical Therapy

## 2024-02-21 DIAGNOSIS — M5459 Other low back pain: Secondary | ICD-10-CM | POA: Diagnosis not present

## 2024-02-21 DIAGNOSIS — M357 Hypermobility syndrome: Secondary | ICD-10-CM | POA: Diagnosis not present

## 2024-02-21 DIAGNOSIS — R7989 Other specified abnormal findings of blood chemistry: Secondary | ICD-10-CM | POA: Diagnosis not present

## 2024-02-21 NOTE — Therapy (Signed)
 OUTPATIENT PHYSICAL THERAPY NOTE Renewal  Patient Name: Diana Proctor MRN: [id number] DOB:May 13, 2000, 23 y.o., female Today's Date: 02/21/2024  END OF SESSION:  PT End of Session - 02/21/24 1350     Visit Number 10    Number of Visits 21    Date for Recertification  03/29/24    Authorization Type BCBS    PT Start Time 1404    PT Stop Time 1449    PT Time Calculation (min) 45 min    Activity Tolerance Patient tolerated treatment well    Behavior During Therapy [facility] for tasks assessed/performed                  Past Medical History:  Diagnosis Date   Headache    Past Surgical History:  Procedure Laterality Date   LAPAROSCOPIC OVARIAN CYSTECTOMY Left 09/04/2020   Procedure: LAPAROSCOPIC OVARIAN CYSTECTOMY;  Surgeon: Barbette Knock, MD;  Location: [facility] OR;  Service: Gynecology;  Laterality: Left;   NO PAST SURGERIES     Patient Active Problem List   Diagnosis Date Noted   Hypermobile Ehlers-Danlos syndrome 02/12/2024   Dysautonomia (HCC) 09/15/2020   Fever of unknown origin 02/28/2020   Disequilibrium 05/17/2019   Migraine without aura and without status migrainosus, not intractable 04/25/2019   Episodic tension-type headache, not intractable 04/25/2019   Central positional vertigo 04/25/2019   Orthostatic hypotension 03/02/2016   Closed head injury without loss of consciousness 12/31/2015   Posttraumatic headache 12/31/2015   Post concussion syndrome 12/31/2015   Adjustment disorder with mixed anxiety and depressed mood 01/08/2013   Acne 01/08/2013   Sleep disturbance 01/08/2013    PCP: Rolinda Millman MD   REFERRING PROVIDER: Joane Birmingham MD   REFERRING DIAG: [phone] (ICD-10-CM) - Hypermobile Ehlers-Danlos syndrome M79.10 (ICD-10-CM) - Myalgia  Rationale for Evaluation and Treatment: Rehabilitation  THERAPY DIAG:  Hypermobility syndrome  Other low back pain  ONSET DATE: chronic  SUBJECTIVE:                                                                                                                                                                                            SUBJECTIVE STATEMENT: No pain today.  Patient was called in from the wait list.   PERTINENT HISTORY:   Sees Cardiology (Dr. 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     Relevant historical information:    PAIN:  Are you having pain? Yes: NPRS scale: no /10 Pain location: back pain, central and Rt low back (can alternate)   Pain description: aching  Aggravating factors: standing too long, certain sitting  positions, intense workouts  Relieving factors: stretching, changing positions.    Rowing makes her back feel sore, but it goes away  PRECAUTIONS: Other: none , monitor dizziness  RED FLAGS: None   WEIGHT BEARING RESTRICTIONS: No  FALLS:  Has patient fallen in last 6 months? 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   NEXT MD VISIT: sees Dr. Joane Heidelberg.   OBJECTIVE:  Note: Objective measures were completed at Evaluation unless otherwise noted.  DIAGNOSTIC FINDINGS:  MRI normal- results requested    PATIENT SURVEYS:  FAS VS LEFS vs MODI NT on eval   COGNITION: Overall cognitive status: Within functional limits for tasks assessed     SENSATION: WFL Notices asymmetry in muscle activation  POSTURE: rounded shoulders and slumped sitting, can correct easily    PALPATION: Hypermobile through lumbar segments L2-L3-L4-L5 Palpation to sacrum produces pain mid lumbar. Hypertonic lumbar extensors   LUMBAR ROM:   AROM  eval 02/16/24  Flexion Palms flat Palms flat   Extension Discomfort central  Discomfort central   Right lateral flexion WFL with pain contra WFL- contra pain   Left lateral flexion WFL with pain contra WFL -contra pain   Right rotation [facility] WFL  Left rotation WFL WFL    (Blank rows = not tested)  LOWER EXTREMITY ROM:     Passive  Right eval Left eval  Hip flexion hyper hyper  Hip extension    Hip abduction    Hip adduction    Hip internal rotation WNL WNL  Hip external rotation WNL WNL   Knee flexion    Knee extension    Ankle dorsiflexion    Ankle plantarflexion    Ankle inversion    Ankle eversion     (Blank rows = not tested)  LOWER EXTREMITY MMT:    L hip weaker in abd   MMT Right eval Left eval 02/16/24  Hip flexion 5 5 5   Hip extension 5 4+ 5  Hip abduction 5 4+ 5  Hip adduction     Hip internal rotation     Hip external rotation     Knee flexion     Knee extension     Ankle dorsiflexion     Ankle plantarflexion     Ankle inversion     Ankle eversion      (Blank rows = not tested)  UE MMT  5/5 bilateral  L scapular winging   LUMBAR SPECIAL TESTS:  Straight leg raise test: Negative, Trendelenburg sign: Negative, and lumbar extension: loss of neutral spine and rectus abdominus dominates    FUNCTIONAL TESTS:  Apparent instability in lumbar with alternating hip extension                      Pain in lumbar with prone knee flexion More patient able to activate multifidus on right side better than the left   TREATMENT DATE:    [facility] Adult PT Treatment:  DATE: 02/21/24 Therapeutic Exercise: Bird dog x 5 each  Horizontal abduction 4 lbs in Q-ped Serratus exercises with looped band (green)  Foam roller longitudianl  Blue band pulls star pattern  Therapeutic Activity: Reformer Short box plank on forearm  Long box OH press Arm lift -elevation off bar  Single arm press out, pain at R elbow  Hip extension   x 10 level pelvis  Sidelying arm work 1 red flexion and scaption x  8  Sidelying feet in straps 1 red sidesweeps    [facility] Adult PT Treatment:                                                DATE: 02/16/24 Therapeutic Exercise: Recumbent bike Supine 90/90- ball under  Reverse toe taps Lat pull over 8 lbs pinch on L side - mod to iso hold with march   NM re-education Quadruped with ball bear plank Multifidus in prone Foam roller Arm arcs March  Clam  Palpation multifidus   [facility] Adult PT Treatment:                                             DATE: 02/13/24 Therapeutic Exercise: Karolynn pose Center, Left, Right Scapular retract/protraction in Q-ped  Alt UE lift in Quadruped x 12,  LE lifts  x12;  Bird dog x 10; cuing for core control Horizontal abd GTB x 15-cueing for trunk position and core control Seated-education and practice for hip hinge with neutral spine for flex and ext (row motion) Manual: DTM to R QL in S/L Trigger Point Dry Needling  Initial Treatment: Pt instructed on Dry Needling rational, procedures, and possible side effects. Pt instructed to expect mild to moderate muscle soreness later in the day and/or into the next day.  Pt instructed in methods to reduce muscle soreness. Pt instructed to continue prescribed HEP. Because Dry Needling was performed over or adjacent to a lung field, pt was educated on S/S of pneumothorax and to seek immediate medical attention should they occur.  Patient was educated on signs and symptoms of infection and other risk factors and advised to seek medical attention should they occur.  Patient verbalized understanding of these instructions and education.   Patient Verbal Consent Given: Yes Education Handout Provided: Yes Muscles Treated: Bil lumbar multifidi L2-L4 Electrical Stimulation Performed: No Treatment Response/Outcome: Palpable increase in muscle length.    PATIENT EDUCATION:  Education details: see above  Person educated:  Patient and Parent Education method: Explanation, Demonstration, and Handouts Education comprehension: verbalized understanding, returned demonstration, and needs further education   HOME EXERCISE PROGRAM: Access Code: [email or web address] URL: [email or web address] Date: 01/16/2024 Prepared by: Delon Norma Access Code: [email or web address] URL: [email or web address] Date: 02/16/2024 Prepared by: Delon Norma  Exercises - Supine 90/90 with Leg Extensions  - 1 x daily - 7 x weekly - 2 sets - 10 reps - 5 hold - Pilates Bridge  - 1 x daily - 7 x weekly - 2 sets - 10 reps - 5 hold - Quadruped Pelvic Floor Contraction with Opposite Arm and Leg Lift  - 1 x daily - 7 x weekly - 2 sets - 10 reps - 5 hold - Dead Bug with Swiss Ball  - 1 x daily -  7 x weekly - 2 sets - 10 reps - 5 hold - Bent Over Single Arm Shoulder Row with Dumbbell  - 1 x daily - 7 x weekly - 2 sets - 10 reps - 5 hold - Scapular Protraction at Wall  - 1 x daily - 7 x weekly - 2 sets - 10 reps - 5 hold - Shoulder Flexion Serratus Activation with Resistance  - 1 x daily - 7 x weekly - 2 sets - 10 reps - 5 hold - Supine Multifidus with Heel Press - Leg Bent  - 1 x daily - 7 x weekly - 1 sets - 10 reps - 5 hold - Multifidus Knee Lift Off With Transverse Abdominis and Pelvic Floor Contraction  - 1 x daily - 7 x weekly - 1 sets - 3-5 reps - 30 hold  ASSESSMENT:  CLINICAL IMPRESSION: Patient has obviously been consistent with her home exercise program.  She had no increased pain today both with upper body and lower body work.  She did have some minor pinching in her elbow and shoulder with overhead motion simulated on the Pilates reformer in prone.  Patient will continue to benefit from skilled PT in order to return to PLOF and optimize functional mobility.      Pt seems to have improving symptoms overall. She does have tightness in paraspinals and in R QL. Dry needling for lumbar spine today, pt with good tolerance. May  benefit from future sessions for back and/or QL. She requires cueing for TA contraction and control with UE and LE movement today. Will benefit from continued care.   OBJECTIVE IMPAIRMENTS: difficulty walking, decreased strength, increased fascial restrictions, improper body mechanics, pain, and hypermobility.   ACTIVITY LIMITATIONS: carrying, lifting, bending, sitting, standing, squatting, and locomotion level  PARTICIPATION LIMITATIONS: community activity and occupation  PERSONAL FACTORS: Time since onset of injury/illness/exacerbation and 3+ comorbidities: POTS, chronic pain, genetic condition  are also affecting patient's functional outcome.   REHAB POTENTIAL: Excellent  CLINICAL DECISION MAKING: Stable/uncomplicated  EVALUATION COMPLEXITY: Low   GOALS: Goals reviewed with patient? Yes  SHORT TERM GOALS=LONG TERM GOALS: Target date: 02/19/2024    Patient will be independent with final HEP upon discharge from PT and report consistent benefit following exercise completion.    Baseline:  Goal status: ongoing   2.  Patient will be able to demonstrate proper posture and lifting techniques related to spine health and reduction of symptoms.   Baseline:  Goal status:ongoing   3.  Patient will be I with concepts of joint protection and stability as it pertains to joint hypermobility. Baseline:  Goal status:ongoing   4.  Patient will be able to demonstrate proper deep core activation for basic and intermediate core stabilization exercises. Baseline:  Goal status: ongoing   5.  Patient will report no pain in her lower back after lifting in the gym Baseline: sore after, uses barbell  Goal status:ongoing   6.  Patient will be able to stand for 30 min without increased pain in low back (cooking, home tasks)   Baseline:  Goal status: NEW   7. Patient will be able to improve her L scapular winging with weightbearing shoulder/core exercise.   Baseline:  Goal status: NEW    PLAN:  PT FREQUENCY: 2x/week 1-2 x   PT DURATION: 4 weeks 4-6   PLANNED INTERVENTIONS: 97164- PT Re-evaluation, 97750- Physical Performance Testing, 97110-Therapeutic exercises, 97530- Therapeutic activity, [id number]- Neuromuscular re-education, 97535- Self Care, 02859- Manual therapy, Patient/Family education, Taping,  Joint mobilization, Spinal mobilization, Cryotherapy, and Moist heat.  PLAN FOR NEXT SESSION: multifidus, TA training. DN lumbar paraspinals , scap winging , foam roller stab.  Check HEP and advance as tolerated.   Delon Norma, PT 02/21/24 1:50 PM Phone: [phone] Fax: [phone]

## 2024-03-15 ENCOUNTER — Encounter: Admitting: Physical Therapy

## 2024-03-15 NOTE — Therapy (Deleted)
 " OUTPATIENT PHYSICAL THERAPY NOTE Renewal  Patient Name: Diana Proctor MRN: [id number] DOB:Apr 29, 2000, 24 y.o., female Today's Date: 03/15/2024  END OF SESSION:            Past Medical History:  Diagnosis Date   Headache    Past Surgical History:  Procedure Laterality Date   LAPAROSCOPIC OVARIAN CYSTECTOMY Left 09/04/2020   Procedure: LAPAROSCOPIC OVARIAN CYSTECTOMY;  Surgeon: Barbette Knock, MD;  Location: [facility] OR;  Service: Gynecology;  Laterality: Left;   NO PAST SURGERIES     Patient Active Problem List   Diagnosis Date Noted   Hypermobile Ehlers-Danlos syndrome 02/12/2024   Dysautonomia (HCC) 09/15/2020   Fever of unknown origin 02/28/2020   Disequilibrium 05/17/2019   Migraine without aura and without status migrainosus, not intractable 04/25/2019   Episodic tension-type headache, not intractable 04/25/2019   Central positional vertigo 04/25/2019   Orthostatic hypotension 03/02/2016   Closed head injury without loss of consciousness 12/31/2015   Posttraumatic headache 12/31/2015   Post concussion syndrome 12/31/2015   Adjustment disorder with mixed anxiety and depressed mood 01/08/2013   Acne 01/08/2013   Sleep disturbance 01/08/2013    PCP: Rolinda Millman MD   REFERRING PROVIDER: Joane Birmingham MD   REFERRING DIAG: [phone] (ICD-10-CM) - Hypermobile Ehlers-Danlos syndrome M79.10 (ICD-10-CM) - Myalgia  Rationale for Evaluation and Treatment: Rehabilitation  THERAPY DIAG:  No diagnosis found.  ONSET DATE: chronic  SUBJECTIVE:                                                                                                                                                                                           SUBJECTIVE STATEMENT: No pain today.  Patient was called in from the wait list.   PERTINENT HISTORY:   Sees Cardiology (Dr. 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     Relevant  historical information:    PAIN:  Are you having pain? Yes: NPRS scale: no /10 Pain location: back pain, central and Rt low back (can alternate)   Pain description: aching  Aggravating factors: standing too long, certain sitting positions, intense workouts  Relieving factors: stretching, changing positions.    Rowing makes her back feel sore, but it goes away  PRECAUTIONS: Other: none , monitor dizziness  RED FLAGS: None   WEIGHT BEARING RESTRICTIONS: No  FALLS:  Has patient fallen in last 6 months? No  LIVING ENVIRONMENT: Lives with: lives with their family Lives in: House/apartment Stairs: no issues  Has following equipment at home: None  OCCUPATION: Works at [employer] in [place] (sore for a few hours after)  PLOF: Independent, Leisure: likes to row, lifts 3 x per week- free weights, occ running/biking, and normal daily  PATIENT GOALS: I want my lower back to stop hurting.   NEXT MD VISIT: sees Dr. Joane Heidelberg.   OBJECTIVE:  Note: Objective measures were completed at Evaluation unless otherwise noted.  DIAGNOSTIC FINDINGS:  MRI normal- results requested    PATIENT SURVEYS:  FAS VS LEFS vs MODI NT on eval   COGNITION: Overall cognitive status: Within functional limits for tasks assessed     SENSATION: WFL Notices asymmetry in muscle activation  POSTURE: rounded shoulders and slumped sitting, can correct easily    PALPATION: Hypermobile through lumbar segments L2-L3-L4-L5 Palpation to sacrum produces pain mid lumbar. Hypertonic lumbar extensors   LUMBAR ROM:   AROM eval 02/16/24  Flexion Palms flat Palms flat   Extension Discomfort central  Discomfort central   Right lateral flexion WFL with pain contra WFL- contra pain   Left lateral flexion WFL with pain contra WFL -contra pain   Right rotation [facility] WFL  Left rotation WFL WFL    (Blank rows = not tested)  LOWER EXTREMITY ROM:     Passive  Right eval Left eval  Hip flexion hyper  hyper  Hip extension    Hip abduction    Hip adduction    Hip internal rotation WNL WNL  Hip external rotation WNL WNL   Knee flexion    Knee extension    Ankle dorsiflexion    Ankle plantarflexion    Ankle inversion    Ankle eversion     (Blank rows = not tested)  LOWER EXTREMITY MMT:    L hip weaker in abd   MMT Right eval Left eval 02/16/24  Hip flexion 5 5 5   Hip extension 5 4+ 5  Hip abduction 5 4+ 5  Hip adduction     Hip internal rotation     Hip external rotation     Knee flexion     Knee extension     Ankle dorsiflexion     Ankle plantarflexion     Ankle inversion     Ankle eversion      (Blank rows = not tested)  UE MMT  5/5 bilateral  L scapular winging   LUMBAR SPECIAL TESTS:  Straight leg raise test: Negative, Trendelenburg sign: Negative, and lumbar extension: loss of neutral spine and rectus abdominus dominates    FUNCTIONAL TESTS:  Apparent instability in lumbar with alternating hip extension                      Pain in lumbar with prone knee flexion More patient able to activate multifidus on right side better than the left   TREATMENT DATE:   [facility] Adult PT Treatment:                                                DATE: 03/15/24 Therapeutic Exercise: *** Manual Therapy: *** Neuromuscular re-ed: *** Therapeutic Activity: *** Modalities: *** Self Care: [facility] Adult PT Treatment:                                                DATE: 02/21/24 Therapeutic Exercise:  Bird dog x 5 each  Horizontal abduction 4 lbs in Q-ped Serratus exercises with looped band (green)  Foam roller longitudianl  Blue band pulls star pattern  Therapeutic Activity: Reformer Short box plank on forearm  Long box OH press Arm lift -elevation off bar  Single arm press out, pain at R elbow  Hip extension  x 10 level pelvis  Sidelying arm work 1 red flexion and scaption x  8  Sidelying feet in straps 1 red sidesweeps    [facility] Adult PT Treatment:                                                 DATE: 02/16/24 Therapeutic Exercise: Recumbent bike Supine 90/90- ball under  Reverse toe taps Lat pull over 8 lbs pinch on L side - mod to iso hold with march   NM re-education Quadruped with ball bear plank Multifidus in prone Foam roller Arm arcs March  Clam  Palpation multifidus   [facility] Adult PT Treatment:                                             DATE: 02/13/24 Therapeutic Exercise: [place] pose Center, Left, Right Scapular retract/protraction in Q-ped  Alt UE lift in Quadruped x 12,  LE lifts  x12;  Bird dog x 10; cuing for core control Horizontal abd GTB x 15-cueing for trunk position and core control Seated-education and practice for hip hinge with neutral spine for flex and ext (row motion) Manual: DTM to R QL in S/L Trigger Point Dry Needling  Initial Treatment: Pt instructed on Dry Needling rational, procedures, and possible side effects. Pt instructed to expect mild to moderate muscle soreness later in the day and/or into the next day.  Pt instructed in methods to reduce muscle soreness. Pt instructed to continue prescribed HEP. Because Dry Needling was performed over or adjacent to a lung field, pt was educated on S/S of pneumothorax and to seek immediate medical attention should they occur.  Patient was educated on signs and symptoms of infection and other risk factors and advised to seek medical attention should they occur.  Patient verbalized understanding of these instructions and education.   Patient Verbal Consent Given: Yes Education Handout Provided: Yes Muscles Treated: Bil lumbar multifidi L2-L4 Electrical Stimulation Performed: No Treatment Response/Outcome: Palpable increase in muscle length.    PATIENT EDUCATION:  Education details: see above  Person educated: Patient and Parent Education method: Explanation, Demonstration, and Handouts Education comprehension: verbalized understanding, returned demonstration, and  needs further education   HOME EXERCISE PROGRAM: Access Code: [email or web address] URL: [email or web address] Date: 01/16/2024 Prepared by: Delon Norma Access Code: [email or web address] URL: [email or web address] Date: 02/16/2024 Prepared by: Delon Norma  Exercises - Supine 90/90 with Leg Extensions  - 1 x daily - 7 x weekly - 2 sets - 10 reps - 5 hold - Pilates Bridge  - 1 x daily - 7 x weekly - 2 sets - 10 reps - 5 hold - Quadruped Pelvic Floor Contraction with Opposite Arm and Leg Lift  - 1 x daily - 7 x weekly - 2 sets - 10 reps - 5 hold - Dead Bug with Swiss Ball  - 1 x daily - 7 x weekly -  2 sets - 10 reps - 5 hold - Bent Over Single Arm Shoulder Row with Dumbbell  - 1 x daily - 7 x weekly - 2 sets - 10 reps - 5 hold - Scapular Protraction at Wall  - 1 x daily - 7 x weekly - 2 sets - 10 reps - 5 hold - Shoulder Flexion Serratus Activation with Resistance  - 1 x daily - 7 x weekly - 2 sets - 10 reps - 5 hold - Supine Multifidus with Heel Press - Leg Bent  - 1 x daily - 7 x weekly - 1 sets - 10 reps - 5 hold - Multifidus Knee Lift Off With Transverse Abdominis and Pelvic Floor Contraction  - 1 x daily - 7 x weekly - 1 sets - 3-5 reps - 30 hold  ASSESSMENT:  CLINICAL IMPRESSION: Patient has obviously been consistent with her home exercise program.  She had no increased pain today both with upper body and lower body work.  She did have some minor pinching in her elbow and shoulder with overhead motion simulated on the Pilates reformer in prone.  Patient will continue to benefit from skilled PT in order to return to PLOF and optimize functional mobility.      Pt seems to have improving symptoms overall. She does have tightness in paraspinals and in R QL. Dry needling for lumbar spine today, pt with good tolerance. May benefit from future sessions for back and/or QL. She requires cueing for TA contraction and control with UE and LE movement today. Will benefit from continued care.    OBJECTIVE IMPAIRMENTS: difficulty walking, decreased strength, increased fascial restrictions, improper body mechanics, pain, and hypermobility.   ACTIVITY LIMITATIONS: carrying, lifting, bending, sitting, standing, squatting, and locomotion level  PARTICIPATION LIMITATIONS: community activity and occupation  PERSONAL FACTORS: Time since onset of injury/illness/exacerbation and 3+ comorbidities: POTS, chronic pain, genetic condition  are also affecting patient's functional outcome.   REHAB POTENTIAL: Excellent  CLINICAL DECISION MAKING: Stable/uncomplicated  EVALUATION COMPLEXITY: Low   GOALS: Goals reviewed with patient? Yes  SHORT TERM GOALS=LONG TERM GOALS: Target date: 02/19/2024    Patient will be independent with final HEP upon discharge from PT and report consistent benefit following exercise completion.    Baseline:  Goal status: ongoing   2.  Patient will be able to demonstrate proper posture and lifting techniques related to spine health and reduction of symptoms.   Baseline:  Goal status:ongoing   3.  Patient will be I with concepts of joint protection and stability as it pertains to joint hypermobility. Baseline:  Goal status:ongoing   4.  Patient will be able to demonstrate proper deep core activation for basic and intermediate core stabilization exercises. Baseline:  Goal status: ongoing   5.  Patient will report no pain in her lower back after lifting in the gym Baseline: sore after, uses barbell  Goal status:ongoing   6.  Patient will be able to stand for 30 min without increased pain in low back (cooking, home tasks)   Baseline:  Goal status: NEW   7. Patient will be able to improve her L scapular winging with weightbearing shoulder/core exercise.   Baseline:  Goal status: NEW   PLAN:  PT FREQUENCY: 2x/week 1-2 x   PT DURATION: 4 weeks 4-6   PLANNED INTERVENTIONS: 02835- PT Re-evaluation, 97750- Physical Performance Testing, 97110-Therapeutic  exercises, 97530- Therapeutic activity, [id number]- Neuromuscular re-education, 97535- Self Care, 02859- Manual therapy, Patient/Family education, Taping, Joint mobilization, Spinal mobilization,  Cryotherapy, and Moist heat.  PLAN FOR NEXT SESSION: multifidus, TA training. DN lumbar paraspinals , scap winging , foam roller stab.  Check HEP and advance as tolerated.   Delon Norma, PT 03/15/2024 7:48 AM Phone: [phone] Fax: [phone]    "

## 2024-03-20 ENCOUNTER — Encounter: Admitting: Physical Therapy

## 2024-03-22 ENCOUNTER — Ambulatory Visit (INDEPENDENT_AMBULATORY_CARE_PROVIDER_SITE_OTHER): Payer: Self-pay | Admitting: Physical Therapy

## 2024-03-22 ENCOUNTER — Encounter: Payer: Self-pay | Admitting: Physical Therapy

## 2024-03-22 DIAGNOSIS — M5459 Other low back pain: Secondary | ICD-10-CM

## 2024-03-22 DIAGNOSIS — M357 Hypermobility syndrome: Secondary | ICD-10-CM

## 2024-03-22 NOTE — Therapy (Signed)
 " OUTPATIENT PHYSICAL THERAPY NOTE DISCHARGED    Patient Name: Diana Proctor MRN: [id number] DOB:January 21, 2001, 24 y.o., female Today's Date: 03/22/2024  END OF SESSION:  PT End of Session - 03/22/24 0808     Visit Number 11    Number of Visits 21    Date for Recertification  03/29/24    Authorization Type BCBS    PT Start Time 0805    PT Stop Time 0845    PT Time Calculation (min) 40 min    Activity Tolerance Patient tolerated treatment well    Behavior During Therapy [facility] for tasks assessed/performed             Past Medical History:  Diagnosis Date   Headache    Past Surgical History:  Procedure Laterality Date   LAPAROSCOPIC OVARIAN CYSTECTOMY Left 09/04/2020   Procedure: LAPAROSCOPIC OVARIAN CYSTECTOMY;  Surgeon: Diana Knock, MD;  Location: [facility] OR;  Service: Gynecology;  Laterality: Left;   NO PAST SURGERIES     Patient Active Problem List   Diagnosis Date Noted   Hypermobile Ehlers-Danlos syndrome 02/12/2024   Dysautonomia (HCC) 09/15/2020   Fever of unknown origin 02/28/2020   Disequilibrium 05/17/2019   Migraine without aura and without status migrainosus, not intractable 04/25/2019   Episodic tension-type headache, not intractable 04/25/2019   Central positional vertigo 04/25/2019   Orthostatic hypotension 03/02/2016   Closed head injury without loss of consciousness 12/31/2015   Posttraumatic headache 12/31/2015   Post concussion syndrome 12/31/2015   Adjustment disorder with mixed anxiety and depressed mood 01/08/2013   Acne 01/08/2013   Sleep disturbance 01/08/2013    PCP: Diana Millman MD   REFERRING PROVIDER: Joane Birmingham MD   REFERRING DIAG: [phone] (ICD-10-CM) - Hypermobile Ehlers-Danlos syndrome M79.10 (ICD-10-CM) - Myalgia  Rationale for Evaluation and Treatment: Rehabilitation  THERAPY DIAG:  Hypermobility syndrome  Other low back pain  ONSET DATE: chronic  SUBJECTIVE:                                                                                                                                                                                            SUBJECTIVE STATEMENT:  [name] reports her back pain is only after a row (Erg).  She did get on the water. My back is tight.  Then I roll it out and its fine.  She still has some shoulder issues at times. Sharp pain in shoulder if rowing.     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     Relevant historical information:  PAIN:  Are you having pain? Yes: NPRS scale: no /10 Pain location: back pain, central and Rt low back (can alternate)   Pain description: aching  Aggravating factors: standing too long, certain sitting positions, intense workouts  Relieving factors: stretching, changing positions.    Rowing makes her back feel sore, but it goes away  PRECAUTIONS: Other: none , monitor dizziness  RED FLAGS: None   WEIGHT BEARING RESTRICTIONS: No  FALLS:  Has patient fallen in last 6 months? 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   NEXT MD VISIT: sees Dr. Joane Proctor.   OBJECTIVE:  Note: Objective measures were completed at Evaluation unless otherwise noted.  DIAGNOSTIC FINDINGS:  MRI normal- results requested    PATIENT SURVEYS:  FAS VS LEFS vs MODI NT on eval   COGNITION: Overall cognitive status: Within functional limits for tasks assessed     SENSATION: WFL Notices asymmetry in muscle activation  POSTURE: rounded shoulders and slumped sitting, can correct easily     PALPATION: Hypermobile through lumbar segments L2-L3-L4-L5 Palpation to sacrum produces pain mid lumbar. Hypertonic lumbar extensors   LUMBAR ROM:   AROM eval 02/16/24  Flexion Palms flat Palms flat   Extension Discomfort central  Discomfort central   Right lateral flexion WFL with pain contra WFL- contra pain   Left lateral flexion WFL with pain contra WFL -contra pain   Right rotation [facility] WFL  Left rotation WFL WFL    (Blank rows = not tested)  LOWER EXTREMITY ROM:     Passive  Right eval Left eval  Hip flexion hyper hyper  Hip extension    Hip abduction    Hip adduction    Hip internal rotation WNL WNL  Hip external rotation WNL WNL   Knee flexion    Knee extension    Ankle dorsiflexion    Ankle plantarflexion    Ankle inversion    Ankle eversion     (Blank rows = not tested)  LOWER EXTREMITY MMT:    L hip weaker in abd   MMT Right eval Left eval 02/16/24  Hip flexion 5 5 5   Hip extension 5 4+ 5  Hip abduction 5 4+ 5  Hip adduction     Hip internal rotation     Hip external rotation     Knee flexion     Knee extension     Ankle dorsiflexion     Ankle plantarflexion     Ankle inversion     Ankle eversion      (Blank rows = not tested)  UE MMT  5/5 bilateral  L scapular winging   LUMBAR SPECIAL TESTS:  Straight leg raise test: Negative, Trendelenburg sign: Negative, and lumbar extension: loss of neutral spine and rectus abdominus dominates    FUNCTIONAL TESTS:  Apparent instability in lumbar with alternating hip extension                      Pain in lumbar with prone knee flexion More patient able to activate multifidus on right side better than the left   TREATMENT DATE:   [facility] Adult  PT Treatment:                                                DATE: 03/22/24 Therapeutic Exercise: Recumbent bike L 3 for 5 min  Supine core with small ball under pelvis  A/P tilt SLR with lat pull over 10 lbs  Dead bug on ball  Ball crunches  ER single  arm blue  ER with flexion side facing  1/2 kneeling OH press 10 lbs each side  1/2 kneeling chop 10 lbs each  Matrix 10 lbs x 10  Hinge in Matrix  x 10 lbs     [facility] Adult PT Treatment:                                                DATE: 02/21/24 Therapeutic Exercise: Bird dog x 5 each  Horizontal abduction 4 lbs in Q-ped Serratus exercises with looped band (green)  Foam roller longitudianl  Blue band pulls star pattern  Therapeutic Activity: Reformer Short box plank on forearm  Long box OH press Arm lift -elevation off bar  Single arm press out, pain at R elbow  Hip extension  x 10 level pelvis  Sidelying arm work 1 red flexion and scaption x  8  Sidelying feet in straps 1 red sidesweeps     PATIENT EDUCATION:  Education details: see above  Person educated: Patient and Parent Education method: Explanation, Facilities Manager, and Handouts Education comprehension: verbalized understanding, returned demonstration, and needs further education   HOME EXERCISE PROGRAM: Access Code: [email or web address] URL: [email or web address] Date: 02/16/2024 Prepared by: Delon Norma  Exercises - Supine 90/90 with Leg Extensions  - 1 x daily - 7 x weekly - 2 sets - 10 reps - 5 hold - Pilates Bridge  - 1 x daily - 7 x weekly - 2 sets - 10 reps - 5 hold - Quadruped Pelvic Floor Contraction with Opposite Arm and Leg Lift  - 1 x daily - 7 x weekly - 2 sets - 10 reps - 5 hold - Dead Bug with Swiss Ball  - 1 x daily - 7 x weekly - 2 sets - 10 reps - 5 hold - Bent Over Single Arm Shoulder Row with Dumbbell  - 1 x daily - 7 x weekly - 2 sets - 10 reps - 5 hold - Scapular Protraction at Wall  - 1 x daily - 7 x weekly - 2 sets - 10 reps - 5 hold - Shoulder Flexion Serratus Activation with Resistance  - 1 x daily - 7 x weekly - 2 sets - 10 reps - 5 hold - Supine Multifidus with Heel Press - Leg Bent  - 1 x daily - 7 x weekly - 1 sets - 10 reps - 5 hold - Multifidus Knee Lift Off With Transverse  Abdominis and Pelvic Floor Contraction  - 1 x daily - 7 x weekly - 1 sets - 3-5 reps - 30 hold  ASSESSMENT:  CLINICAL IMPRESSION: Patient overall over min and temporary relief of her back pain. She is able to lift, row and participate in recreation and recover well with simple stretching.  She a full core routine and understands how stability and control should always be  the goal of her workouts.  She is agreeable to DC at this time and knows she can return if and when she needs to.    OBJECTIVE IMPAIRMENTS: difficulty walking, decreased strength, increased fascial restrictions, improper body mechanics, pain, and hypermobility.   ACTIVITY LIMITATIONS: carrying, lifting, bending, sitting, standing, squatting, and locomotion level  PARTICIPATION LIMITATIONS: community activity and occupation  PERSONAL FACTORS: Time since onset of injury/illness/exacerbation and 3+ comorbidities: POTS, chronic pain, genetic condition  are also affecting patient's functional outcome.   REHAB POTENTIAL: Excellent  CLINICAL DECISION MAKING: Stable/uncomplicated  EVALUATION COMPLEXITY: Low   GOALS: Goals reviewed with patient? Yes  SHORT TERM GOALS=LONG TERM GOALS: Target date: 02/19/2024    Patient will be independent with final HEP upon discharge from PT and report consistent benefit following exercise completion.    Baseline:  Goal status: MET   2.  Patient will be able to demonstrate proper posture and lifting techniques related to spine health and reduction of symptoms.   Baseline:  Goal status:MET  3.  Patient will be I with concepts of joint protection and stability as it pertains to joint hypermobility. Baseline:  Goal status:MET   4.  Patient will be able to demonstrate proper deep core activation for basic and intermediate core stabilization exercises. Baseline:  Goal status: MET   5.  Patient will report no pain in her lower back after lifting in the gym Baseline: sore after, uses  barbell  Goal status:MET , rowing still makes it sore.   6.  Patient will be able to stand for 30 min without increased pain in low back (cooking, home tasks)   Baseline:  Goal status: MET   7. Patient will be able to improve her L scapular winging with weightbearing shoulder/core exercise.   Baseline:  Goal status: partial met   PLAN:  PT FREQUENCY: 2x/week 1-2 x   PT DURATION: 4 weeks 4-6   PLANNED INTERVENTIONS: 02835- PT Re-evaluation, 97750- Physical Performance Testing, 97110-Therapeutic exercises, 97530- Therapeutic activity, [id number]- Neuromuscular re-education, 97535- Self Care, 02859- Manual therapy, Patient/Family education, Taping, Joint mobilization, Spinal mobilization, Cryotherapy, and Moist heat.  PLAN FOR NEXT SESSION: NA, DC>   Delon Norma, PT 03/22/2024 8:09 AM Phone: [phone] Fax: [phone]    "

## 2024-03-27 ENCOUNTER — Encounter: Admitting: Physical Therapy

## 2024-03-29 ENCOUNTER — Encounter: Admitting: Physical Therapy

## 2024-04-03 ENCOUNTER — Encounter: Admitting: Physical Therapy

## 2024-04-05 ENCOUNTER — Encounter: Admitting: Physical Therapy

## 2024-04-10 ENCOUNTER — Encounter: Admitting: Physical Therapy

## 2024-04-12 ENCOUNTER — Encounter: Admitting: Physical Therapy

## 2024-04-15 ENCOUNTER — Ambulatory Visit: Payer: Self-pay | Admitting: Family Medicine
